# Patient Record
Sex: Female | Born: 1940
Health system: Southern US, Community
[De-identification: ages and names within clinical notes are randomized; demographics above are authoritative.]

## PROBLEM LIST (undated history)

## (undated) DIAGNOSIS — N809 Endometriosis, unspecified: Secondary | ICD-10-CM

## (undated) DIAGNOSIS — M810 Age-related osteoporosis without current pathological fracture: Secondary | ICD-10-CM

## (undated) DIAGNOSIS — F0390 Unspecified dementia without behavioral disturbance: Secondary | ICD-10-CM

## (undated) DIAGNOSIS — M1990 Unspecified osteoarthritis, unspecified site: Secondary | ICD-10-CM

## (undated) DIAGNOSIS — Z8781 Personal history of (healed) traumatic fracture: Secondary | ICD-10-CM

## (undated) DIAGNOSIS — E785 Hyperlipidemia, unspecified: Secondary | ICD-10-CM

## (undated) HISTORY — DX: Endometriosis, unspecified: N80.9

## (undated) HISTORY — DX: Personal history of (healed) traumatic fracture: Z87.81

## (undated) HISTORY — DX: Age-related osteoporosis without current pathological fracture: M81.0

## (undated) HISTORY — PX: ABDOMINAL HYSTERECTOMY: SHX81

## (undated) HISTORY — DX: Unspecified osteoarthritis, unspecified site: M19.90

## (undated) HISTORY — PX: SPINE SURGERY: SHX786

## (undated) HISTORY — DX: Hyperlipidemia, unspecified: E78.5

---

## 2006-04-24 ENCOUNTER — Ambulatory Visit: Payer: Self-pay | Admitting: Family Medicine

## 2006-07-21 ENCOUNTER — Emergency Department (HOSPITAL_COMMUNITY): Admission: EM | Admit: 2006-07-21 | Discharge: 2006-07-21 | Payer: Self-pay | Admitting: Emergency Medicine

## 2006-12-18 ENCOUNTER — Ambulatory Visit: Payer: Self-pay | Admitting: Family Medicine

## 2009-08-06 ENCOUNTER — Ambulatory Visit: Payer: Self-pay | Admitting: Interventional Radiology

## 2009-08-06 ENCOUNTER — Emergency Department (HOSPITAL_BASED_OUTPATIENT_CLINIC_OR_DEPARTMENT_OTHER): Admission: EM | Admit: 2009-08-06 | Discharge: 2009-08-06 | Payer: Self-pay | Admitting: Emergency Medicine

## 2010-09-16 LAB — URINE MICROSCOPIC-ADD ON

## 2010-09-16 LAB — URINALYSIS, ROUTINE W REFLEX MICROSCOPIC
Glucose, UA: NEGATIVE mg/dL
Nitrite: NEGATIVE
Protein, ur: 30 mg/dL — AB
Specific Gravity, Urine: 1.026 (ref 1.005–1.030)
Urobilinogen, UA: 1 mg/dL (ref 0.0–1.0)
pH: 6 (ref 5.0–8.0)

## 2010-09-16 LAB — URINE CULTURE: Culture: NO GROWTH

## 2013-06-24 ENCOUNTER — Ambulatory Visit (INDEPENDENT_AMBULATORY_CARE_PROVIDER_SITE_OTHER): Payer: Medicare Other | Admitting: General Practice

## 2013-06-24 ENCOUNTER — Encounter: Payer: Self-pay | Admitting: General Practice

## 2013-06-24 VITALS — BP 140/79 | HR 80 | Temp 97.5°F | Ht 61.5 in | Wt 91.0 lb

## 2013-06-24 DIAGNOSIS — Z0000 Encounter for general adult medical examination without abnormal findings: Secondary | ICD-10-CM

## 2013-06-24 NOTE — Progress Notes (Signed)
   Subjective:    Patient ID: Eileen Kidd, female    DOB: 08-Oct-1940, 72 y.o.   MRN: [id number]  HPI Patient presents for annual physical exam. She denies any chronic health conditions or taking prescription medications. Reports decreasing cigarette smoking over past 6 months. She has gone from smoking 1.5 ppd to 1pack per week. Reports eating a healthy diet and walks for exercise as tolerated due to right knee injury.     Review of Systems  Constitutional: Negative for fever and chills.  Respiratory: Negative for chest tightness and shortness of breath.   Cardiovascular: Negative for chest pain and palpitations.  All other systems reviewed and are negative.       Objective:   Physical Exam  Constitutional: She is oriented to person, place, and time. She appears well-developed and well-nourished.  HENT:  Head: Normocephalic and atraumatic.  Right Ear: External ear normal.  Left Ear: External ear normal.  Eyes: EOM are normal. Pupils are equal, round, and reactive to light.  Neck: Normal range of motion. Neck supple. No thyromegaly present.  Cardiovascular: Normal rate, regular rhythm and normal heart sounds.   Pulmonary/Chest: Effort normal and breath sounds normal. No respiratory distress. She exhibits no tenderness.  Abdominal: Soft. Bowel sounds are normal. She exhibits no distension. There is no tenderness.  Lymphadenopathy:    She has no cervical adenopathy.  Neurological: She is alert and oriented to person, place, and time.  Skin: Skin is warm and dry.  Psychiatric: She has a normal mood and affect.          Assessment & Plan:  1. Annual physical exam - POCT CBC - CMP14+EGFR - NMR, lipoprofile Labs pending Discussed benefits of regular exercise and healthy eating RTO prn and for routine follow up Patient verbalized understanding Coralie Keens, FNP-C

## 2013-06-24 NOTE — Patient Instructions (Signed)

## 2013-06-25 LAB — CBC WITH DIFFERENTIAL
Basos: 1 %
Eos: 1 %
Immature Grans (Abs): 0 10*3/uL (ref 0.0–0.1)
Immature Granulocytes: 0 %
Lymphocytes Absolute: 3.4 10*3/uL — ABNORMAL HIGH (ref 0.7–3.1)
Monocytes Absolute: 0.9 10*3/uL (ref 0.1–0.9)
Monocytes: 9 %
Neutrophils Absolute: 5.3 10*3/uL (ref 1.4–7.0)
Neutrophils Relative %: 54 %
RBC: 4.36 x10E6/uL (ref 3.77–5.28)
RDW: 13.9 % (ref 12.3–15.4)
WBC: 9.8 10*3/uL (ref 3.4–10.8)

## 2013-06-25 LAB — NMR, LIPOPROFILE
HDL Cholesterol by NMR: 81 mg/dL (ref >=40)
LDL Size: 21.2 nm (ref >20.5)
Small LDL Particle Number: <90 nmol/L (ref <=527)
Triglycerides by NMR: 83 mg/dL (ref <150)

## 2013-06-25 LAB — CMP14+EGFR
Albumin/Globulin Ratio: 2.3 (ref 1.1–2.5)
BUN/Creatinine Ratio: 16 (ref 11–26)
BUN: 10 mg/dL (ref 8–27)
Calcium: 9.9 mg/dL (ref 8.6–10.2)
Creatinine, Ser: 0.61 mg/dL (ref 0.57–1.00)
GFR calc non Af Amer: 90 mL/min/{1.73_m2} (ref >59)
Globulin, Total: 2 g/dL (ref 1.5–4.5)
Glucose: 96 mg/dL (ref 65–99)
Potassium: 4.8 mmol/L (ref 3.5–5.2)
Total Bilirubin: 0.3 mg/dL (ref 0.0–1.2)
Total Protein: 6.6 g/dL (ref 6.0–8.5)

## 2013-06-27 ENCOUNTER — Other Ambulatory Visit: Payer: Self-pay | Admitting: General Practice

## 2013-07-03 ENCOUNTER — Telehealth: Payer: Self-pay | Admitting: General Practice

## 2013-07-03 NOTE — Telephone Encounter (Signed)
Patient aware.

## 2013-08-28 ENCOUNTER — Telehealth: Payer: Self-pay | Admitting: General Practice

## 2013-08-29 NOTE — Telephone Encounter (Signed)
PAtient aware 

## 2013-08-29 NOTE — Telephone Encounter (Signed)
Patient aware.

## 2014-05-28 ENCOUNTER — Telehealth: Payer: Self-pay | Admitting: Family Medicine

## 2014-05-28 ENCOUNTER — Ambulatory Visit: Payer: Medicare Other | Admitting: Family Medicine

## 2014-05-28 NOTE — Telephone Encounter (Signed)
APPT MADE

## 2014-08-15 ENCOUNTER — Ambulatory Visit (INDEPENDENT_AMBULATORY_CARE_PROVIDER_SITE_OTHER): Payer: Medicare Other | Admitting: Family Medicine

## 2014-08-15 ENCOUNTER — Encounter: Payer: Self-pay | Admitting: Family Medicine

## 2014-08-15 VITALS — BP 124/62 | HR 82 | Temp 97.1°F | Ht 61.5 in | Wt 100.0 lb

## 2014-08-15 DIAGNOSIS — Z0000 Encounter for general adult medical examination without abnormal findings: Secondary | ICD-10-CM

## 2014-08-15 NOTE — Progress Notes (Deleted)
   Subjective:    Patient ID: Eileen Kidd, female    DOB: 07-Apr-1941, 74 y.o.   MRN: [id number]  HPI Eileen Kidd is acute little 74 -year-old female who is here for a physical. 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 She smokes 2-3 cigarettes a day. She has no chronic diseases. Her only medications are a women's vitamin a baby aspirin and fish oil. Her basic risk is for osteoporosis given her slight stature but she has gained about 9 pounds since her last visit here little over a year ago. She has no complaints except for some arthritis in her left knee, that due to old trauma.  There are no active problems to display for this patient.  Outpatient Encounter Prescriptions as of 08/15/2014  Medication Sig  . aspirin 81 MG tablet Take 81 mg by mouth daily.  . Multiple Vitamins-Minerals (MULTIVITAMIN & MINERAL PO) Take by mouth.  . Omega-3 Fatty Acids (FISH OIL) 1000 MG CAPS Take 1,000 mg by mouth 2 (two) times daily.     Review of Systems  Constitutional: Negative.   HENT: Negative.   Eyes: Negative.   Respiratory: Negative.   Cardiovascular: Negative.   Gastrointestinal: Negative.   Endocrine: Negative.   Genitourinary: Negative.   Hematological: Negative.   Psychiatric/Behavioral: Negative.        Objective:   Physical Exam  Constitutional: She is oriented to person, place, and time. She appears well-developed and well-nourished.  Eyes: Conjunctivae and EOM are normal.  Neck: Normal range of motion. Neck supple.  Cardiovascular: Normal rate, regular rhythm and normal heart sounds.   Pulmonary/Chest: Effort normal and breath sounds normal.  Abdominal: Soft. Bowel sounds are normal.  Musculoskeletal: Normal range of motion.  Neurological: She is alert and oriented to person, place, and time. She has normal reflexes.  Skin: Skin is warm and dry.  Psychiatric: She has a normal mood and affect. Her behavior is normal. Thought content normal.    BP 124/62 mmHg  Pulse 82   Temp(Src) 97.1 F (36.2 C) (Oral)  Ht 5' 1.5" (1.562 m)  Wt 100 lb (45.36 kg)  BMI 18.59 kg/m2       Assessment & Plan:  1. Annual physical exam Exam is normal.  Has aged out of basic screening exams except bone density.  Would do labs next year, eg glucose, lipids, CBC  Frederica KusterStephen M Miller MD

## 2014-08-18 ENCOUNTER — Telehealth: Payer: Self-pay | Admitting: Family Medicine

## 2014-08-18 NOTE — Telephone Encounter (Signed)
Stp advised he plans to do labs next year.

## 2014-10-14 ENCOUNTER — Ambulatory Visit: Payer: Medicare Other | Admitting: *Deleted

## 2015-04-01 ENCOUNTER — Ambulatory Visit: Payer: Medicare Other | Admitting: Family Medicine

## 2015-04-02 ENCOUNTER — Ambulatory Visit: Payer: Medicare Other | Admitting: Family Medicine

## 2015-04-14 ENCOUNTER — Ambulatory Visit (INDEPENDENT_AMBULATORY_CARE_PROVIDER_SITE_OTHER): Payer: Medicare Other | Admitting: Family Medicine

## 2015-04-14 ENCOUNTER — Encounter: Payer: Self-pay | Admitting: Family Medicine

## 2015-04-14 VITALS — BP 131/66 | HR 72 | Temp 98.4°F | Ht 61.5 in | Wt 101.8 lb

## 2015-04-14 DIAGNOSIS — Z0000 Encounter for general adult medical examination without abnormal findings: Secondary | ICD-10-CM

## 2015-04-14 DIAGNOSIS — Z23 Encounter for immunization: Secondary | ICD-10-CM

## 2015-04-14 DIAGNOSIS — Z13820 Encounter for screening for osteoporosis: Secondary | ICD-10-CM

## 2015-04-14 NOTE — Progress Notes (Signed)
Subjective:   Eileen Kidd is a 74 y.o. female who presents for Medicare Annual (Subsequent) preventive examination.  Review of Systems:  Review of Systems  Constitutional: Negative for fever and chills.  HENT: Negative for ear pain and tinnitus.   Eyes: Negative for blurred vision and pain.  Respiratory: Negative for cough, shortness of breath and wheezing.   Cardiovascular: Negative for chest pain, palpitations and leg swelling.  Gastrointestinal: Negative for abdominal pain, diarrhea, constipation, blood in stool and melena.  Genitourinary: Negative for dysuria and hematuria.  Musculoskeletal: Negative for myalgias, back pain and joint pain.  Skin: Negative for rash.  Neurological: Negative for dizziness, sensory change, focal weakness, weakness and headaches.  Psychiatric/Behavioral: Negative for depression and suicidal ideas.      Objective:     Vitals: BP 131/66 mmHg  Pulse 72  Temp(Src) 98.4 F (36.9 C) (Oral)  Ht 5' 1.5" (1.562 m)  Wt 101 lb 12.8 oz (46.176 kg)  BMI 18.93 kg/m2  Tobacco History  Smoking status  . Current Every Day Smoker -- 0.25 packs/day  . Types: Cigarettes  Smokeless tobacco  . Never Used     Ready to quit: Not Answered Counseling given: Not Answered   Past Medical History  Diagnosis Date  . Arthritis     left knee   Past Surgical History  Procedure Laterality Date  . Spine surgery      L5-S1 removal   Family History  Problem Relation Age of Onset  . Heart attack Mother   . Heart attack Father   . Diabetes Brother   . Stroke Brother    History  Sexual Activity  . Sexual Activity: Not on file    Outpatient Encounter Prescriptions as of 04/14/2015  Medication Sig  . aspirin 81 MG tablet Take 81 mg by mouth daily.  . Multiple Vitamins-Minerals (MULTIVITAMIN & MINERAL PO) Take by mouth.  . Omega-3 Fatty Acids (FISH OIL) 1000 MG CAPS Take 1,000 mg by mouth 2 (two) times daily.   No facility-administered encounter  medications on file as of 04/14/2015.    Activities of Daily Living In your present state of health, do you have any difficulty performing the following activities: 04/14/2015  Hearing? N  Vision? N  Difficulty concentrating or making decisions? N  Walking or climbing stairs? N  Dressing or bathing? N  Doing errands, shopping? N  Preparing Food and eating ? N  Using the Toilet? N  In the past six months, have you accidently leaked urine? N  Do you have problems with loss of bowel control? N  Managing your Medications? N  Managing your Finances? N  Housekeeping or managing your Housekeeping? N    Patient Care Team: Nils PyleJoshua A Dettinger, MD as PCP - General (Family Medicine)    Assessment:    Problem List Items Addressed This Visit    None    Visit Diagnoses    Routine history and physical examination of adult    -  Primary    Osteoporosis screening        Relevant Orders    DG Bone Density      Exercise Activities and Dietary recommendations Current Exercise Habits:: Home exercise routine, Type of exercise: walking;strength training/weights, Time (Minutes): 50, Frequency (Times/Week): 4, Weekly Exercise (Minutes/Week): 200, Intensity: Moderate  Goals    None     Fall Risk Fall Risk  04/14/2015 08/15/2014  Falls in the past year? No No   Depression Screen PHQ 2/9  Scores 04/14/2015 08/15/2014  PHQ - 2 Score 0 0     Cognitive Testing MMSE - Mini Mental State Exam 04/14/2015  Orientation to time 5  Orientation to Place 5  Registration 3  Attention/ Calculation 5  Recall 2  Language- name 2 objects 2  Language- repeat 1  Language- follow 3 step command 3  Language- read & follow direction 1  Write a sentence 1  Copy design 1  Total score 29    Immunization History  Administered Date(s) Administered  . Influenza Split 04/03/2013  . Influenza, High Dose Seasonal PF 04/18/2014   Screening Tests Health Maintenance  Topic Date Due  . ZOSTAVAX  10/12/2015  (Originally 08/06/1998)  . DEXA SCAN  04/12/2016 (Originally 08/07/2003)  . TETANUS/TDAP  04/12/2016 (Originally 08/06/1957)  . PNA vac Low Risk Adult (1 of 2 - PCV13) 04/12/2016 (Originally 08/07/2003)  . INFLUENZA VACCINE  01/26/2016      Plan:    see assessment  During the course of the visit the patient was educated and counseled about the following appropriate screening and preventive services:   Vaccines to include Pneumoccal, Influenza, Hepatitis B, Td, Zostavax, HCV  Electrocardiogram  Cardiovascular Disease  Colorectal cancer screening  Bone density screening  Diabetes screening  Glaucoma screening  Mammography/PAP  Nutrition counseling   Patient Instructions (the written plan) was given to the patient.   Nils Pyle, MD  04/14/2015

## 2015-04-15 ENCOUNTER — Ambulatory Visit (INDEPENDENT_AMBULATORY_CARE_PROVIDER_SITE_OTHER): Payer: Medicare Other

## 2015-04-15 DIAGNOSIS — Z780 Asymptomatic menopausal state: Secondary | ICD-10-CM | POA: Diagnosis not present

## 2015-04-15 DIAGNOSIS — Z13820 Encounter for screening for osteoporosis: Secondary | ICD-10-CM

## 2015-04-16 ENCOUNTER — Encounter: Payer: Self-pay | Admitting: Pharmacist

## 2015-04-16 ENCOUNTER — Ambulatory Visit (INDEPENDENT_AMBULATORY_CARE_PROVIDER_SITE_OTHER): Payer: Medicare Other | Admitting: Pharmacist

## 2015-04-16 ENCOUNTER — Telehealth: Payer: Self-pay | Admitting: Family Medicine

## 2015-04-16 VITALS — Ht 61.5 in | Wt 100.5 lb

## 2015-04-16 DIAGNOSIS — M810 Age-related osteoporosis without current pathological fracture: Secondary | ICD-10-CM

## 2015-04-16 MED ORDER — ALENDRONATE SODIUM 70 MG PO TABS: 70.0000 mg | ORAL_TABLET | ORAL | Status: DC

## 2015-04-16 NOTE — Progress Notes (Signed)
[facility]steoporosis [facility] Current Height: Height: 5' 1.5" (156.2 cm)      Max Lifetime Height:  5' 2" Current Weight: Weight: 100 lb 8 oz (45.587 kg)       Ethnicity:Caucasian    HPI: Does pt already have a diagnosis of:  Osteopenia?  No Osteoporosis?  No  Back Pain?  No       Kyphosis?  No Prior fracture?  Yes knee cap - caused by fall and  Med(s) for Osteoporosis/Osteopenia:  none Med(s) previously tried for Osteoporosis/Osteopenia:  none                                                             PMH: Age at menopause:  56's Hysterectomy?  No Oophorectomy?  No HRT? No Steroid Use?  No Thyroid med?  No History of cancer?  No History of digestive disorders (ie Crohn's)?  No Current or previous eating disorders?  No Last Vitamin D Result:  Not done Last GFR Result:  Not done in 2 years   FH/SH: Family history of osteoporosis?  No Parent with history of hip fracture?  No Family history of breast cancer?  Yes - daughter Exercise?  Yes - walking and leg lifts Smoking?  Yes Alcohol?  No    Calcium Assessment Calcium Intake  # of servings/day  Calcium mg  Milk (8 oz) 0.5  x  300  = 157m  Yogurt (4 oz) 1 x  200 = 2037m Cheese (1 oz) 0 x  200 = 0  Other Calcium sources   25031mCa supplement MVI = 400m54mEstimated calcium intake per day 1000mg46mDEXA Results Date of Test T-Score for AP Spine L1-L4 T-Score for Total Left Hip T-Score for Total Right Hip  04/15/2015 -4.9 -4.2 -4.2                  Assessment: Osteoporosis with history of knee cap fracture   Recommendations: 1.  Start  alendronate (FOSAMAX) 70mg 21m 1 tablet weekly 2.  recommend calcium 1200mg d23m through supplementation or diet.  3.  recommend weight bearing exercise - 30 minutes at least 4 days per week.   4.  Counseled and educated about fall risk and prevention. 5.  Discussed smoking's effects on bone health.  Patient is considering stopping but is not ready yet.  Tips for cessation discussed  and patient to notify office if she needed assistance or Rx for smoking cessation aids.  Orders Placed This Encounter  Procedures  . CMP14+EGFR  . Vit D  25 hydroxy (rtn osteoporosis monitoring)  . PTH, Intact and Calcium     Recheck DEXA:  2 years  Time spent counseling patient:  30 minutes   Nona Gracey ECherre RobinsD, CPP

## 2015-04-16 NOTE — Patient Instructions (Signed)
Bone Health Bones protect organs, store calcium, and anchor muscles. Good health habits, such as eating nutritious foods and exercising regularly, are important for maintaining healthy bones. They can also help to prevent a condition that causes bones to lose density and become weak and brittle (osteoporosis). WHY IS BONE MASS IMPORTANT? Bone mass refers to the amount of bone tissue that you have. The higher your bone mass, the stronger your bones. An important step toward having healthy bones throughout life is to have strong and dense bones during childhood. A young adult who has a high bone mass is more likely to have a high bone mass later in life. Bone mass at its greatest it is called peak bone mass. A large decline in bone mass occurs in older adults. In women, it occurs about the time of menopause. During this time, it is important to practice good health habits, because if more bone is lost than what is replaced, the bones will become less healthy and more likely to break (fracture). If you find that you have a low bone mass, you may be able to prevent osteoporosis or further bone loss by changing your diet and lifestyle. HOW CAN I FIND OUT IF MY BONE MASS IS LOW? Bone mass can be measured with an X-ray test that is called a bone mineral density (BMD) test. This test is recommended for all women who are age 65 or older. It may also be recommended for men who are age 70 or older, or for people who are more likely to develop osteoporosis due to:  Having bones that break easily.  Having a long-term disease that weakens bones, such as kidney disease or rheumatoid arthritis.  Having menopause earlier than normal.  Taking medicine that weakens bones, such as steroids, thyroid hormones, or hormone treatment for breast cancer or prostate cancer.  Smoking.  Drinking three or more alcoholic drinks each day. WHAT ARE THE NUTRITIONAL RECOMMENDATIONS FOR HEALTHY BONES? To have healthy bones, you need  to get enough of the right minerals and vitamins. Most nutrition experts recommend getting these nutrients from the foods that you eat. Nutritional recommendations vary from person to person. Ask your health care provider what is healthy for you. Here are some general guidelines. Calcium Recommendations Calcium is the most important (essential) mineral for bone health. Most people can get enough calcium from their diet, but supplements may be recommended for people who are at risk for osteoporosis. Good sources of calcium include:  Dairy products, such as low-fat or nonfat milk, cheese, and yogurt.  Dark green leafy vegetables, such as bok choy and broccoli.  Calcium-fortified foods, such as orange juice, cereal, bread, soy beverages, and tofu products.  Nuts, such as almonds. Follow these recommended amounts for daily calcium intake:  Children, age 1-3: 700 mg.  Children, age 4-8: 1,000 mg.  Children, age 9-13: 1,300 mg.  Teens, age 14-18: 1,300 mg.  Adults, age 19-50: 1,000 mg.  Adults, age 51-70:  Men: 1,000 mg.  Women: 1,200 mg.  Adults, age 71 or older: 1,200 mg.  Pregnant and breastfeeding females:  Teens: 1,300 mg.  Adults: 1,000 mg. Vitamin D Recommendations Vitamin D is the most essential vitamin for bone health. It helps the body to absorb calcium. Sunlight stimulates the skin to make vitamin D, so be sure to get enough sunlight. If you live in a cold climate or you do not get outside often, your health care provider may recommend that you take vitamin D supplements. Good   sources of vitamin D in your diet include:  Egg yolks.  Saltwater fish.  Milk and cereal fortified with vitamin D. Follow these recommended amounts for daily vitamin D intake:  Children and teens, age 1-18: 600 international units.  Adults, age 50 or younger: 400-800 international units.  Adults, age 51 or older: 800-1,000 international units. Other Nutrients Other nutrients for bone  health include:  Phosphorus. This mineral is found in meat, poultry, dairy foods, nuts, and legumes. The recommended daily intake for adult men and adult women is 700 mg.  Magnesium. This mineral is found in seeds, nuts, dark green vegetables, and legumes. The recommended daily intake for adult men is 400-420 mg. For adult women, it is 310-320 mg.  Vitamin K. This vitamin is found in green leafy vegetables. The recommended daily intake is 120 mg for adult men and 90 mg for adult women. WHAT TYPE OF PHYSICAL ACTIVITY IS BEST FOR BUILDING AND MAINTAINING HEALTHY BONES? Weight-bearing and strength-building activities are important for building and maintaining peak bone mass. Weight-bearing activities cause muscles and bones to work against gravity. Strength-building activities increases muscle strength that supports bones. Weight-bearing and muscle-building activities include:  Walking and hiking.  Jogging and running.  Dancing.  Gym exercises.  Lifting weights.  Tennis and racquetball.  Climbing stairs.  Aerobics. Adults should get at least 30 minutes of moderate physical activity on most days. Children should get at least 60 minutes of moderate physical activity on most days. Ask your health care provide what type of exercise is best for you. WHERE CAN I FIND MORE INFORMATION? For more information, check out the following websites:  National Osteoporosis Foundation: http://nof.org/learn/basics  National Institutes of Health: http://www.niams.nih.gov/Health_Info/Bone/Bone_Health/bone_health_for_life.asp   This information is not intended to replace advice given to you by your health care provider. Make sure you discuss any questions you have with your health care provider.   Document Released: 09/03/2003 Document Revised: 10/28/2014 Document Reviewed: 06/18/2014 Elsevier Interactive Patient Education 2016 [employer]  

## 2015-04-16 NOTE — Telephone Encounter (Signed)
Patient has question about when to start alendronate.  Question answered - recommended that she wait to start until labs return.

## 2015-04-17 LAB — CMP14+EGFR
A/G RATIO: 2.3 (ref 1.1–2.5)
ALT: 11 IU/L (ref 0–32)
AST: 19 IU/L (ref 0–40)
Albumin: 4.4 g/dL (ref 3.5–4.8)
Alkaline Phosphatase: 54 IU/L (ref 39–117)
BILIRUBIN TOTAL: 0.3 mg/dL (ref 0.0–1.2)
BUN/Creatinine Ratio: 15 (ref 11–26)
BUN: 9 mg/dL (ref 8–27)
CHLORIDE: 99 mmol/L (ref 97–106)
CO2: 25 mmol/L (ref 18–29)
Calcium: 9.9 mg/dL (ref 8.7–10.3)
Creatinine, Ser: 0.61 mg/dL (ref 0.57–1.00)
GFR calc Af Amer: 102 mL/min/{1.73_m2} (ref >59)
GFR calc non Af Amer: 88 mL/min/{1.73_m2} (ref >59)
GLOBULIN, TOTAL: 1.9 g/dL (ref 1.5–4.5)
Glucose: 107 mg/dL — ABNORMAL HIGH (ref 65–99)
POTASSIUM: 4.9 mmol/L (ref 3.5–5.2)
SODIUM: 139 mmol/L (ref 136–144)
Total Protein: 6.3 g/dL (ref 6.0–8.5)

## 2015-04-17 LAB — PTH, INTACT AND CALCIUM: PTH: 22 pg/mL (ref 15–65)

## 2015-04-17 LAB — VITAMIN D 25 HYDROXY (VIT D DEFICIENCY, FRACTURES): Vit D, 25-Hydroxy: 36.8 ng/mL (ref 30.0–100.0)

## 2015-04-21 ENCOUNTER — Telehealth: Payer: Self-pay | Admitting: Pharmacist

## 2015-04-22 NOTE — Telephone Encounter (Signed)
Labs have been mailed.

## 2015-06-17 NOTE — Progress Notes (Signed)
   Subjective:    Patient ID: Eileen Kidd, female    DOB: 1940-10-30, 74 y.o.   MRN: [id number]  HPI Eileen Kidd is acute little 74 -year-old female who is here for a physical. 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 She smokes 2-3 cigarettes a day. She has no chronic diseases. Her only medications are a women's vitamin a baby aspirin and fish oil. Her basic risk is for osteoporosis given her slight stature but she has gained about 9 pounds since her last visit here little over a year ago. She has no complaints except for some arthritis in her left knee, that due to old trauma.  There are no active problems to display for this patient.  Outpatient Encounter Prescriptions as of 08/15/2014  Medication Sig  . aspirin 81 MG tablet Take 81 mg by mouth daily.  . Multiple Vitamins-Minerals (MULTIVITAMIN & MINERAL PO) Take by mouth.  . Omega-3 Fatty Acids (FISH OIL) 1000 MG CAPS Take 1,000 mg by mouth 2 (two) times daily.     Review of Systems  Constitutional: Negative.   HENT: Negative.   Eyes: Negative.   Respiratory: Negative.   Cardiovascular: Negative.   Gastrointestinal: Negative.   Endocrine: Negative.   Genitourinary: Negative.   Hematological: Negative.   Psychiatric/Behavioral: Negative.        Objective:   Physical Exam  Constitutional: She is oriented to person, place, and time. She appears well-developed and well-nourished.  Eyes: Conjunctivae and EOM are normal.  Neck: Normal range of motion. Neck supple.  Cardiovascular: Normal rate, regular rhythm and normal heart sounds.   Pulmonary/Chest: Effort normal and breath sounds normal.  Abdominal: Soft. Bowel sounds are normal.  Musculoskeletal: Normal range of motion.  Neurological: She is alert and oriented to person, place, and time. She has normal reflexes.  Skin: Skin is warm and dry.  Psychiatric: She has a normal mood and affect. Her behavior is normal. Thought content normal.    BP 124/62 mmHg  Pulse 82   Temp(Src) 97.1 F (36.2 C) (Oral)  Ht 5' 1.5" (1.562 m)  Wt 100 lb (45.36 kg)  BMI 18.59 kg/m2       Assessment & Plan:  1. Annual physical exam Exam is normal.  Has aged out of basic screening exams except bone density.  Would do labs next year, eg glucose, lipids, CBC

## 2015-07-17 ENCOUNTER — Ambulatory Visit: Payer: Self-pay | Admitting: Family Medicine

## 2015-07-20 ENCOUNTER — Ambulatory Visit: Payer: Self-pay | Admitting: Family Medicine

## 2015-07-21 ENCOUNTER — Encounter: Payer: Self-pay | Admitting: Family Medicine

## 2015-07-24 ENCOUNTER — Ambulatory Visit (INDEPENDENT_AMBULATORY_CARE_PROVIDER_SITE_OTHER): Payer: Medicare Other | Admitting: Family Medicine

## 2015-07-24 ENCOUNTER — Encounter: Payer: Self-pay | Admitting: Family Medicine

## 2015-07-24 ENCOUNTER — Telehealth: Payer: Self-pay

## 2015-07-24 VITALS — BP 139/71 | HR 78 | Temp 97.7°F | Ht 61.5 in | Wt 103.0 lb

## 2015-07-24 DIAGNOSIS — M810 Age-related osteoporosis without current pathological fracture: Secondary | ICD-10-CM | POA: Diagnosis not present

## 2015-07-24 NOTE — Progress Notes (Signed)
BP 139/71 mmHg  Pulse 78  Temp(Src) 97.7 F (36.5 C) (Oral)  Ht 5' 1.5" (1.562 m)  Wt 103 lb (46.72 kg)  BMI 19.15 kg/m2   Subjective:    Patient ID: Eileen Kidd, female    DOB: 04/10/1941, 75 y.o.   MRN: [id number]  HPI: Eileen Kidd is a 75 y.o. female presenting on 07/24/2015 for Labwork, 3 month followup   HPI Osteoporosis Patient presents today because she has been having issues with the Fosamax which she is taking for osteoporosis. The reviewing her bone scan that shows that her T score was negative 4 and she does have a very significant need for a medication for osteoporosis. We discussed the possibility of the injectables how they cause less upset stomach that she had with the Fosamax. She is agreeable to try one of those and we will set her up with appointment with Cherre Robins after prior authorization to run to see that they will be covered. She also feels like she may have fallen and cracked one of her ribs on the left side of her chest couple weeks ago. She still is having a little bit of pain with it but no difficulties breathing and does not necessarily want to do an x-ray today.  Relevant past medical, surgical, family and social history reviewed and updated as indicated. Interim medical history since our last visit reviewed. Allergies and medications reviewed and updated.  Review of Systems  Constitutional: Negative for fever and chills.  HENT: Negative for congestion, ear discharge and ear pain.   Eyes: Negative for redness and visual disturbance.  Respiratory: Negative for chest tightness and shortness of breath.   Cardiovascular: Negative for chest pain and leg swelling.  Gastrointestinal: Positive for nausea, abdominal pain and diarrhea. Negative for vomiting, constipation and blood in stool.  Genitourinary: Negative for dysuria and difficulty urinating.  Musculoskeletal: Positive for arthralgias. Negative for back pain, joint swelling and gait problem.    Skin: Negative for rash.  Neurological: Negative for dizziness, light-headedness and headaches.  Psychiatric/Behavioral: Negative for behavioral problems and agitation.  All other systems reviewed and are negative.   Per HPI unless specifically indicated above     Medication List       This list is accurate as of: 07/24/15  8:52 AM.  Always use your most recent med list.               aspirin 81 MG tablet  Take 81 mg by mouth daily.     Fish Oil 1000 MG Caps  Take 1,000 mg by mouth 2 (two) times daily.     MULTIVITAMIN & MINERAL PO  Take by mouth.           Objective:    BP 139/71 mmHg  Pulse 78  Temp(Src) 97.7 F (36.5 C) (Oral)  Ht 5' 1.5" (1.562 m)  Wt 103 lb (46.72 kg)  BMI 19.15 kg/m2  Wt Readings from Last 3 Encounters:  07/24/15 103 lb (46.72 kg)  04/16/15 100 lb 8 oz (45.587 kg)  04/14/15 101 lb 12.8 oz (46.176 kg)    Physical Exam  Constitutional: She is oriented to person, place, and time. She appears well-developed and well-nourished. No distress.  Eyes: Conjunctivae and EOM are normal. Pupils are equal, round, and reactive to light.  Neck: Neck supple. No thyromegaly present.  Cardiovascular: Normal rate, regular rhythm, normal heart sounds and intact distal pulses.   No murmur heard. Pulmonary/Chest: Effort normal and breath  sounds normal. No respiratory distress. She has no wheezes.  Abdominal: Soft. Bowel sounds are normal. She exhibits no distension. There is no tenderness. There is no rebound.  Musculoskeletal: Normal range of motion. She exhibits tenderness (slight tenderness over left lateral ribs but no mobility is detected.). She exhibits no edema.  Lymphadenopathy:    She has no cervical adenopathy.  Neurological: She is alert and oriented to person, place, and time. Coordination normal.  Skin: Skin is warm and dry. No rash noted. She is not diaphoretic.  Psychiatric: She has a normal mood and affect. Her behavior is normal.  Nursing  note and vitals reviewed.   Results for orders placed or performed in visit on 04/16/15  CMP14+EGFR  Result Value Ref Range   Glucose 107 (H) 65 - 99 mg/dL   BUN 9 8 - 27 mg/dL   Creatinine, Ser 0.61 0.57 - 1.00 mg/dL   GFR calc non Af Amer 88 >59 mL/min/1.73   GFR calc Af Amer 102 >59 mL/min/1.73   BUN/Creatinine Ratio 15 11 - 26   Sodium 139 136 - 144 mmol/L   Potassium 4.9 3.5 - 5.2 mmol/L   Chloride 99 97 - 106 mmol/L   CO2 25 18 - 29 mmol/L   Calcium 9.9 8.7 - 10.3 mg/dL   Total Protein 6.3 6.0 - 8.5 g/dL   Albumin 4.4 3.5 - 4.8 g/dL   Globulin, Total 1.9 1.5 - 4.5 g/dL   Albumin/Globulin Ratio 2.3 1.1 - 2.5   Bilirubin Total 0.3 0.0 - 1.2 mg/dL   Alkaline Phosphatase 54 39 - 117 IU/L   AST 19 0 - 40 IU/L   ALT 11 0 - 32 IU/L  Vit D  25 hydroxy (rtn osteoporosis monitoring)  Result Value Ref Range   Vit D, 25-Hydroxy 36.8 30.0 - 100.0 ng/mL  PTH, Intact and Calcium  Result Value Ref Range   PTH 22 15 - 65 pg/mL   PTH Comment       Assessment & Plan:   Problem List Items Addressed This Visit      Musculoskeletal and Integument   Osteoporosis - Primary    Patient did not tolerate Fosamax and wants to try an injectable, will set up with Tammy to see if we can get an injectable approved through her insurance         Follow up plan: Return in about 6 months (around 01/21/2016), or if symptoms worsen or fail to improve, for Needs appointment with Tammy for osteoporosis.  Counseling provided for all of the vaccine components No orders of the defined types were placed in this encounter.    Caryl Pina, MD [facility] 07/24/2015, 8:52 AM

## 2015-07-24 NOTE — Telephone Encounter (Signed)
Will verify cost of prolia, Reclast and Forteo for patient and discuss at appt 08/12/15

## 2015-07-24 NOTE — Patient Instructions (Signed)
Patient should take at least 500 mg of supplemental calcium and 800 international units of vitamin D daily.  Patient did not tolerate Fosamax and wants to try an injectable, will set up with Tammy to see if we can get an injectable approved through her insurance for the osteoporosis.

## 2015-07-24 NOTE — Telephone Encounter (Signed)
Dr. Louanne Skye had spoken with you about this patient this morning.  She had been on Fosamax but has discontinued because of side effects.  Dr. Louanne Skye had spoken with you about injectables for her and you were going to check on coverage with her insurance before her upcoming appointment with you.  This message is sent just as a reminder.  Thank you!!

## 2015-07-28 NOTE — Assessment & Plan Note (Signed)
Patient did not tolerate Fosamax and wants to try an injectable, will set up with Tammy to see if we can get an injectable approved through her insurance

## 2015-08-12 ENCOUNTER — Encounter: Payer: Self-pay | Admitting: Pharmacist

## 2015-08-12 ENCOUNTER — Ambulatory Visit (INDEPENDENT_AMBULATORY_CARE_PROVIDER_SITE_OTHER): Payer: Medicare Other | Admitting: Pharmacist

## 2015-08-12 ENCOUNTER — Ambulatory Visit: Payer: Medicare Other | Admitting: Pharmacist

## 2015-08-12 DIAGNOSIS — M810 Age-related osteoporosis without current pathological fracture: Secondary | ICD-10-CM | POA: Diagnosis not present

## 2015-08-12 NOTE — Progress Notes (Signed)
Osteoporosis Clinic  HPI: Patient with osteoporosis.  She started alendronate about 4 months ago but after 4 doses she experienced GI problems and unsteady gait which she felt was related to alendronate Does pt already have a diagnosis of:  Osteopenia?  No Osteoporosis?  No  Back Pain?  No       Kyphosis?  No Prior fracture?  Yes; knee cap - caused by fall and rib  Med(s) for Osteoporosis/Osteopenia:  none Med(s) previously tried for Osteoporosis/Osteopenia:  alendronate                                                             PMH: Age at menopause:  53's Hysterectomy?  No Oophorectomy?  No HRT? No Steroid Use?  No Thyroid med?  No History of cancer?  No History of digestive disorders (ie Crohn's)?  No Current or previous eating disorders?  No Last Vitamin D Result:  Not done Last GFR Result:  Not done in 2 years   FH/SH: Family history of osteoporosis?  No Parent with history of hip fracture?  No Family history of breast cancer?  Yes - daughter Exercise?  Yes - walking and leg lifts Smoking?  Yes - has cut back to to 4 to 5 per day and has gone as long as 4 days without smoking so far this year.   Alcohol?  No    Calcium Assessment Calcium Intake  # of servings/day  Calcium mg  Milk (8 oz) 0.5  x  300  =   Yogurt (4 oz) 1 x  200 =   Cheese (1 oz) 0 x  200 = 0  Other Calcium sources     Ca supplement MVI =    Estimated calcium intake per day     DEXA Results Date of Test T-Score for AP Spine L1-L4 T-Score for Total Left Hip T-Score for Total Right Hip  04/15/2015 -4.9 -4.2 -4.2                  Assessment: Osteoporosis with history of knee cap fracture   Recommendations: 1. Disucss several options such as Reclast, Prolia and Forteo (Forteo is non formulary).  Patient would like to try Prolia - verifying insurance coverage 2.  Increase calcium to  daily through supplementation or diet.  3.  recommend weight bearing exercise -  30 minutes at least 4 days per week.   4.  Counseled and educated about fall risk and prevention. 5.  Discussed smoking's effects on bone health.  Patient is continuing to decreased how much she is smoking. Patient given information about smoking cessation and [facility] Quit Counsellor DEXA:  2 years  Time spent counseling patient:  30 minutes   Henrene Pastor, PharmD, CPP

## 2015-08-12 NOTE — Patient Instructions (Signed)
Exercise for Strong Bones  Exercise is important to build and maintain strong bones / bone density.  There are 2 types of exercises that are important to building and maintaining strong bones:  Weight- bearing and muscle-stregthening.  Weight-bearing Exercises  These exercises include activities that make you move against gravity while staying upright. Weight-bearing exercises can be high-impact or low-impact.  High-impact weight-bearing exercises help build bones and keep them strong. If you have broken a bone due to osteoporosis or are at risk of breaking a bone, you may need to avoid high-impact exercises. If you're not sure, you should check with your healthcare provider.  Examples of high-impact weight-bearing exercises are: Dancing  Doing high-impact aerobics  Hiking  Jogging/running  Jumping Rope  Stair climbing  Tennis  Low-impact weight-bearing exercises can also help keep bones strong and are a safe alternative if you cannot do high-impact exercises.   Examples of low-impact weight-bearing exercises are: Using elliptical training machines  Doing low-impact aerobics  Using stair-step machines  Fast walking on a treadmill or outside   Muscle-Strengthening Exercises These exercises include activities where you move your body, a weight or some other resistance against gravity. They are also known as resistance exercises and include: Lifting weights  Using elastic exercise bands  Using weight machines  Lifting your own body weight  Functional movements, such as standing and rising up on your toes  Yoga and Pilates can also improve strength, balance and flexibility. However, certain positions may not be safe for people with osteoporosis or those at increased risk of broken bones. For example, exercises that have you bend forward may increase the chance of breaking a bone in the spine.   Non-Impact Exercises There are other types of exercises that can help  prevent falls.  Non-impact exercises can help you to improve balance, posture and how well you move in everyday activities. Some of these exercises include: Balance exercises that strengthen your legs and test your balance, such as Tai Chi, can decrease your risk of falls.  Posture exercises that improve your posture and reduce rounded or "sloping" shoulders can help you decrease the chance of breaking a bone, especially in the spine.  Functional exercises that improve how well you move can help you with everyday activities and decrease your chance of falling and breaking a bone. For example, if you have trouble getting up from a chair or climbing stairs, you should do these activities as exercises.   **A physical therapist can teach you balance, posture and functional exercises. He/she can also help you learn which exercises are safe and appropriate for you.  [facility] has a physical therapy office in [place] in front of our office and referrals can be made for assessments and treatment as needed and strength and balance training.  If you would like to have an assessment with Chad and our physical therapy team please let a nurse or provider know.    Fall Prevention in the Home  Falls can cause injuries and can affect people from all age groups. There are many simple things that you can do to make your home safe and to help prevent falls. WHAT CAN I DO ON THE OUTSIDE OF MY HOME?  Regularly repair the edges of walkways and driveways and fix any cracks.  Remove high doorway thresholds.  Trim any shrubbery on the main path into your home.  Use bright outdoor lighting.  Clear walkways of debris and clutter, including tools and rocks.  Regularly check   that handrails are securely fastened and in good repair. Both sides of any steps should have handrails.  Install guardrails along the edges of any raised decks or porches.  Have leaves, snow, and ice cleared regularly.  Use sand or salt on  walkways during winter months.  In the garage, clean up any spills right away, including grease or oil spills. WHAT CAN I DO IN THE BATHROOM?  Use night lights.  Install grab bars by the toilet and in the tub and shower. Do not use towel bars as grab bars.  Use non-skid mats or decals on the floor of the tub or shower.  If you need to sit down while you are in the shower, use a plastic, non-slip stool..  Keep the floor dry. Immediately clean up any water that spills on the floor.  Remove soap buildup in the tub or shower on a regular basis.  Attach bath mats securely with double-sided non-slip rug tape.  Remove throw rugs and other tripping hazards from the floor. WHAT CAN I DO IN THE BEDROOM?  Use night lights.  Make sure that a bedside light is easy to reach.  Do not use oversized bedding that drapes onto the floor.  Have a firm chair that has side arms to use for getting dressed.  Remove throw rugs and other tripping hazards from the floor. WHAT CAN I DO IN THE KITCHEN?   Clean up any spills right away.  Avoid walking on wet floors.  Place frequently used items in easy-to-reach places.  If you need to reach for something above you, use a sturdy step stool that has a grab bar.  Keep electrical cables out of the way.  Do not use floor polish or wax that makes floors slippery. If you have to use wax, make sure that it is non-skid floor wax.  Remove throw rugs and other tripping hazards from the floor. WHAT CAN I DO IN THE STAIRWAYS?  Do not leave any items on the stairs.  Make sure that there are handrails on both sides of the stairs. Fix handrails that are broken or loose. Make sure that handrails are as long as the stairways.  Check any carpeting to make sure that it is firmly attached to the stairs. Fix any carpet that is loose or worn.  Avoid having throw rugs at the top or bottom of stairways, or secure the rugs with carpet tape to prevent them from  moving.  Make sure that you have a light switch at the top of the stairs and the bottom of the stairs. If you do not have them, have them installed. WHAT ARE SOME OTHER FALL PREVENTION TIPS?  Wear closed-toe shoes that fit well and support your feet. Wear shoes that have rubber soles or low heels.  When you use a stepladder, make sure that it is completely opened and that the sides are firmly locked. Have someone hold the ladder while you are using it. Do not climb a closed stepladder.  Add color or contrast paint or tape to grab bars and handrails in your home. Place contrasting color strips on the first and last steps.  Use mobility aids as needed, such as canes, walkers, scooters, and crutches.  Turn on lights if it is dark. Replace any light bulbs that burn out.  Set up furniture so that there are clear paths. Keep the furniture in the same spot.  Fix any uneven floor surfaces.  Choose a carpet design that does not   hide the edge of steps of a stairway.  Be aware of any and all pets.  Review your medicines with your healthcare provider. Some medicines can cause dizziness or changes in blood pressure, which increase your risk of falling. Talk with your health care provider about other ways that you can decrease your risk of falls. This may include working with a physical therapist or trainer to improve your strength, balance, and endurance.   This information is not intended to replace advice given to you by your health care provider. Make sure you discuss any questions you have with your health care provider.   Document Released: 06/03/2002 Document Revised: 10/28/2014 Document Reviewed: 07/18/2014 Elsevier Interactive Patient Education 2016 [employer]vier Inc.  

## 2015-08-19 ENCOUNTER — Telehealth: Payer: Self-pay | Admitting: Pharmacist

## 2015-08-19 NOTE — Telephone Encounter (Signed)
Patient called with information regarding insurance verification for Prolia - Patient's portion would be 20% or about $170.  Patient asked if she could pay in 6 monthly payments of $30.  I will check with our insurance / payment dept.   Payment plan ok's and appt for 08/31/15.

## 2015-08-31 ENCOUNTER — Ambulatory Visit (INDEPENDENT_AMBULATORY_CARE_PROVIDER_SITE_OTHER): Payer: Medicare Other | Admitting: Pharmacist

## 2015-08-31 ENCOUNTER — Encounter (INDEPENDENT_AMBULATORY_CARE_PROVIDER_SITE_OTHER): Payer: Self-pay

## 2015-08-31 DIAGNOSIS — M810 Age-related osteoporosis without current pathological fracture: Secondary | ICD-10-CM

## 2015-08-31 MED ORDER — DENOSUMAB 60 MG/ML ~~LOC~~ SOLN
60.0000 mg | Freq: Once | SUBCUTANEOUS | Status: AC
  Administered 2015-08-31: 60 mg via SUBCUTANEOUS

## 2015-08-31 NOTE — Progress Notes (Signed)
Patient ID: Eileen Kidd, female   DOB: 21-Jul-1940, 75 y.o.   MRN: [id number] [facility]  HPI: Patient with osteoporosis.  She started alendronate about 4 months ago but after 4 doses she experienced GI problems and unsteady gait which she felt was related to alendronate.  She is getting first Prolia injection today.   Does pt already have a diagnosis of:  Osteopenia?  No Osteoporosis?  No  Back Pain?  No       Kyphosis?  No Prior fracture?  Yes; knee cap - caused by fall and rib  Med(s) for Osteoporosis/Osteopenia:  none Med(s) previously tried for Osteoporosis/Osteopenia:  alendronate                                                             PMH: Age at menopause:  6750's Hysterectomy?  No Oophorectomy?  No HRT? No Steroid Use?  No Thyroid med?  No History of cancer?  No History of digestive disorders (ie Crohn's)?  No Current or previous eating disorders?  No Last Vitamin D Result:  Not done Last GFR Result:  Not done in 2 years   FH/SH: Family history of osteoporosis?  No Parent with history of hip fracture?  No Family history of breast cancer?  Yes - daughter Exercise?  Yes - walking and leg lifts Smoking?  Yes - has cut back to to 4 to 5 per day and has gone as long as 4 days without smoking so far this year.   Alcohol?  No    Calcium Assessment Calcium Intake  # of servings/day  Calcium mg  Milk (8 oz) 0.5  x  300  = 150mg   Yogurt (4 oz) 1 x  200 = 200mg   Cheese (1 oz) 0 x  200 = 0  Other Calcium sources   250mg   Ca supplement MVI = 400mg    Estimated calcium intake per day 1000mg     DEXA Results Date of Test T-Score for AP Spine L1-L4 T-Score for Total Left Hip T-Score for Total Right Hip  04/15/2015 -4.9 -4.2 -4.2                  Assessment: Osteoporosis with history of knee cap fracture   Recommendations: 1. Administered Prolia 60mg  SQ in left upper arm 2.  Continue calcium to 1200mg  daily through supplementation or diet.  3.  continue  weight bearing exercise - 30 minutes at least 4 days per week.   4.  Counseled and educated about fall risk and prevention.  Recheck DEXA:  2 years  Time spent counseling patient:  20 minutes   Eileen Pastorammy Denetria Kidd, PharmD, CPP

## 2015-12-16 ENCOUNTER — Telehealth: Payer: Self-pay | Admitting: Family Medicine

## 2015-12-16 NOTE — Telephone Encounter (Signed)
Patient wanted to see if she could get Prolia injection when she sees Dr Museum/gallery exhibitions officerDettinger in July.   Last dose was 08/31/15 - explained that can only adminster q 6 months. No due until 02/2016.

## 2015-12-23 ENCOUNTER — Ambulatory Visit: Payer: Medicare Other | Admitting: Family Medicine

## 2015-12-23 ENCOUNTER — Ambulatory Visit (INDEPENDENT_AMBULATORY_CARE_PROVIDER_SITE_OTHER): Payer: Medicare Other | Admitting: Family Medicine

## 2015-12-23 ENCOUNTER — Encounter: Payer: Self-pay | Admitting: Family Medicine

## 2015-12-23 VITALS — BP 138/72 | HR 72 | Temp 97.6°F | Ht 61.5 in | Wt 98.2 lb

## 2015-12-23 DIAGNOSIS — R519 Headache, unspecified: Secondary | ICD-10-CM

## 2015-12-23 DIAGNOSIS — R51 Headache: Secondary | ICD-10-CM

## 2015-12-23 DIAGNOSIS — H9313 Tinnitus, bilateral: Secondary | ICD-10-CM | POA: Diagnosis not present

## 2015-12-23 NOTE — Progress Notes (Signed)
BP 138/72 mmHg  Pulse 72  Temp(Src) 97.6 F (36.4 C) (Oral)  Ht 5' 1.5" (1.562 m)  Wt 98 lb 3.2 oz (44.543 kg)  BMI 18.26 kg/m2   Subjective:    Patient ID: Eileen Kidd, female    DOB: 11/21/1940, 75 y.o.   MRN: [id number]  HPI: Eileen Kidd is a 75 y.o. female presenting on 12/23/2015 for Tinnitus   HPI Headache and ringing in ears Patient has been having a headache and ringing or years since been going on for the past couple weeks since she bumped her head against a counter. The ringing in the ears is on both ears evenly and has been increasing over the past few days. The headaches are mostly in the frontal region worse on the right than the left. The headache is described as a tight squeezing. She denies any visual deficits or focal numbness or weakness or changes in taste or smell. Besides the rain and the headache she does not have any other symptoms from this.  Relevant past medical, surgical, family and social history reviewed and updated as indicated. Interim medical history since our last visit reviewed. Allergies and medications reviewed and updated.  Review of Systems  Constitutional: Negative for fever and chills.  HENT: Positive for tinnitus. Negative for congestion, ear discharge and ear pain.   Eyes: Negative for redness and visual disturbance.  Respiratory: Negative for chest tightness and shortness of breath.   Cardiovascular: Negative for chest pain and leg swelling.  Genitourinary: Negative for dysuria and difficulty urinating.  Musculoskeletal: Negative for back pain and gait problem.  Skin: Negative for rash.  Neurological: Positive for headaches. Negative for dizziness and light-headedness.  Psychiatric/Behavioral: Negative for behavioral problems and agitation.  All other systems reviewed and are negative.   Per HPI unless specifically indicated above     Medication List       This list is accurate as of: 12/23/15  2:39 PM.  Always use your most  recent med list.               Fish Oil 1000 MG Caps  Take 1,000 mg by mouth 2 (two) times daily.     MULTIVITAMIN & MINERAL PO  Take by mouth.           Objective:    BP 138/72 mmHg  Pulse 72  Temp(Src) 97.6 F (36.4 C) (Oral)  Ht 5' 1.5" (1.562 m)  Wt 98 lb 3.2 oz (44.543 kg)  BMI 18.26 kg/m2  Wt Readings from Last 3 Encounters:  12/23/15 98 lb 3.2 oz (44.543 kg)  07/24/15 103 lb (46.72 kg)  04/16/15 100 lb 8 oz (45.587 kg)    Physical Exam  Constitutional: She is oriented to person, place, and time. She appears well-developed and well-nourished. No distress.  HENT:  Right Ear: External ear normal.  Nose: Nose normal.  Mouth/Throat: Oropharynx is clear and moist. No oropharyngeal exudate.  Eyes: Conjunctivae and EOM are normal. Pupils are equal, round, and reactive to light.  Neck: Neck supple. No thyromegaly present.  Cardiovascular: Normal rate, regular rhythm, normal heart sounds and intact distal pulses.   No murmur heard. Pulmonary/Chest: Effort normal and breath sounds normal. No respiratory distress. She has no wheezes.  Musculoskeletal: Normal range of motion. She exhibits no edema or tenderness (No tenderness upon palpation of head).  Lymphadenopathy:    She has no cervical adenopathy.  Neurological: She is alert and oriented to person, place, and time. She  displays normal reflexes. No cranial nerve deficit. She exhibits normal muscle tone. Coordination normal.  Skin: Skin is warm and dry. No rash noted. She is not diaphoretic.  Psychiatric: She has a normal mood and affect. Her behavior is normal.  Nursing note and vitals reviewed.     Assessment & Plan:   Problem List Items Addressed This Visit    None    Visit Diagnoses    Acute nonintractable headache, unspecified headache type    -  Primary    Headache and ringing in ears developed 2 weeks ago when she hit her head on a cabinet. If not improved to this point. Recommended ibuprofen for a week  BID    Ringing in ears, bilateral            Follow up plan: Return if symptoms worsen or fail to improve.  Counseling provided for all of the vaccine components No orders of the defined types were placed in this encounter.    Arville CareJoshua Dettinger, MD [facility] 12/23/2015, 2:39 PM

## 2016-01-01 ENCOUNTER — Ambulatory Visit (INDEPENDENT_AMBULATORY_CARE_PROVIDER_SITE_OTHER): Payer: Medicare Other | Admitting: Physician Assistant

## 2016-01-01 ENCOUNTER — Encounter: Payer: Self-pay | Admitting: Physician Assistant

## 2016-01-01 VITALS — BP 135/73 | HR 74 | Temp 97.2°F | Ht 61.5 in | Wt 102.0 lb

## 2016-01-01 DIAGNOSIS — H9311 Tinnitus, right ear: Secondary | ICD-10-CM | POA: Diagnosis not present

## 2016-01-01 MED ORDER — AMOXICILLIN 875 MG PO TABS: 875.0000 mg | ORAL_TABLET | Freq: Two times a day (BID) | ORAL | Status: DC

## 2016-01-01 NOTE — Patient Instructions (Signed)
Tinnitus  Tinnitus refers to hearing a sound when there is no actual source for that sound. This is often described as ringing in the ears. However, people with this condition may hear a variety of noises. A person may hear the sound in one ear or in both ears.   The sounds of tinnitus can be soft, loud, or somewhere in between. Tinnitus can last for a few seconds or can be constant for days. It may go away without treatment and come back at various times. When tinnitus is constant or happens often, it can lead to other problems, such as trouble sleeping and trouble concentrating.  Almost everyone experiences tinnitus at some point. Tinnitus that is long-lasting (chronic) or comes back often is a problem that may require medical attention.   CAUSES   The cause of tinnitus is often not known. In some cases, it can result from other problems or conditions, including:   · Exposure to loud noises from machinery, music, or other sources.  · Hearing loss.  · Ear or sinus infections.  · Earwax buildup.  · A foreign object in the ear.  · Use of certain medicines.  · Use of alcohol and caffeine.  · High blood pressure.  · Heart diseases.  · Anemia.  · Allergies.  · Meniere disease.  · Thyroid problems.  · Tumors.  · An enlarged part of a weakened blood vessel (aneurysm).  SYMPTOMS  The main symptom of tinnitus is hearing a sound when there is no source for that sound. It may sound like:   · Buzzing.  · Roaring.  · Ringing.  · Blowing air, similar to the sound heard when you listen to a seashell.  · Hissing.  · Whistling.  · Sizzling.  · Humming.  · Running water.  · A sustained musical note.  DIAGNOSIS   Tinnitus is diagnosed based on your symptoms. Your health care provider will do a physical exam. A comprehensive hearing exam (audiologic exam) will be done if your tinnitus:   · Affects only one ear (unilateral).  · Causes hearing difficulties.  · Lasts 6 months or longer.  You may also need to see a health care provider  who specializes in hearing disorders (audiologist). You may be asked to complete a questionnaire to determine the severity of your tinnitus. Tests may be done to help determine the cause and to rule out other conditions. These can include:  · Imaging studies of your head and brain, such as:    A CT scan.    An MRI.  · An imaging study of your blood vessels (angiogram).  TREATMENT   Treating an underlying medical condition can sometimes make tinnitus go away. If your tinnitus continues, other treatments may include:  · Medicines, such as certain antidepressants or sleeping aids.  · Sound generators to mask the tinnitus. These include:  ¨ Tabletop sound machines that play relaxing sounds to help you fall asleep.  ¨ Wearable devices that fit in your ear and play sounds or music.  ¨ A small device that uses headphones to deliver a signal embedded in music (acoustic neural stimulation). In time, this may change the pathways of your brain and make you less sensitive to tinnitus. This device is used for very severe cases when no other treatment is working.  · Therapy and counseling to help you manage the stress of living with tinnitus.  · Using hearing aids or cochlear implants, if your tinnitus is related to hearing   loss.  HOME CARE INSTRUCTIONS  · When possible, avoid being in loud places and being exposed to loud sounds.  · Wear hearing protection, such as earplugs, when you are exposed to loud noises.  · Do not take stimulants, such as nicotine, alcohol, or caffeine.  · Practice techniques for reducing stress, such as meditation, yoga, or deep breathing.  · Use a white noise machine, a humidifier, or other devices to mask the sound of tinnitus.  · Sleep with your head slightly raised. This may reduce the impact of tinnitus.  · Try to get plenty of rest each night.  SEEK MEDICAL CARE IF:  · You have tinnitus in just one ear.  · Your tinnitus continues for 3 weeks or longer without stopping.  · Home care measures are not  helping.  · You have tinnitus after a head injury.  · You have tinnitus along with any of the following:    Dizziness.    Loss of balance.    Nausea and vomiting.     This information is not intended to replace advice given to you by your health care provider. Make sure you discuss any questions you have with your health care provider.     Document Released: 06/13/2005 Document Revised: 07/04/2014 Document Reviewed: 11/13/2013  Elsevier Interactive Patient Education ©2016 [employer]

## 2016-01-01 NOTE — Progress Notes (Signed)
Subjective:     Patient ID: Eileen EthMonna R Kidd, female   DOB: [birth year], 75 y.o.   MRN: [id number]  HPI Pt with ringing to the R ear x 1 week   Review of Systems  Constitutional: Negative for fever and chills.  HENT: Negative for congestion, ear discharge, ear pain, hearing loss, postnasal drip, rhinorrhea, sinus pressure, sneezing and sore throat.        Objective:   Physical Exam  Constitutional: She appears well-developed and well-nourished.  HENT:  Right Ear: External ear normal.  Left Ear: External ear normal.  Mouth/Throat: Oropharynx is clear and moist. No oropharyngeal exudate.  TM's with nl landmarks Decrease hearing bilat with hearing screen Tympanogram nl on the L  And flattened and on the R  Nursing note and vitals reviewed.      Assessment:     1. Tinnitus, right        Plan:     Questions possible infection given tympanogram and sudden onset Trial of Amox 875mg  bid x 10 days If sx continue will have her refer to ENT for further eval Continue al other meds

## 2016-01-11 ENCOUNTER — Ambulatory Visit (INDEPENDENT_AMBULATORY_CARE_PROVIDER_SITE_OTHER): Payer: Medicare Other | Admitting: Family Medicine

## 2016-01-11 ENCOUNTER — Encounter: Payer: Self-pay | Admitting: Family Medicine

## 2016-01-11 VITALS — BP 138/68 | HR 69 | Temp 98.0°F | Ht 61.5 in | Wt 102.8 lb

## 2016-01-11 DIAGNOSIS — H9313 Tinnitus, bilateral: Secondary | ICD-10-CM | POA: Diagnosis not present

## 2016-01-11 NOTE — Progress Notes (Signed)
BP 138/68 mmHg  Pulse 69  Temp(Src) 98 F (36.7 C) (Oral)  Ht 5' 1.5" (1.562 m)  Wt 102 lb 12.8 oz (46.63 kg)  BMI 19.11 kg/m2   Subjective:    Patient ID: Eileen Kidd, female    DOB: [birth year], 75 y.o.   MRN: [id number]  HPI: SHAMECKA HOCUTT is a 75 y.o. female presenting on 01/11/2016 for Follow-up   HPI Tinnitus Patient comes in today for recheck on tinnitus. She says it is improved greatly with antibiotic that she was on and although still present and patient would like to wait for now on seeing ENT and will give [place] a call in a month and see if better or not and decide at that point  Relevant past medical, surgical, family and social history reviewed and updated as indicated. Interim medical history since our last visit reviewed. Allergies and medications reviewed and updated.  Review of Systems  Constitutional: Negative for fever and chills.  HENT: Positive for tinnitus. Negative for congestion, ear discharge, ear pain and sore throat.   Eyes: Negative for redness and visual disturbance.  Respiratory: Negative for chest tightness and shortness of breath.   Cardiovascular: Negative for chest pain and leg swelling.  Genitourinary: Negative for dysuria and difficulty urinating.  Musculoskeletal: Negative for back pain and gait problem.  Skin: Negative for rash.  Neurological: Negative for light-headedness and headaches.  Psychiatric/Behavioral: Negative for behavioral problems and agitation.  All other systems reviewed and are negative.   Per HPI unless specifically indicated above     Medication List       This list is accurate as of: 01/11/16  2:02 PM.  Always use your most recent med list.               Fish Oil 1000 MG Caps  Take 1,000 mg by mouth 2 (two) times daily.     MULTIVITAMIN & MINERAL PO  Take by mouth.           Objective:    BP 138/68 mmHg  Pulse 69  Temp(Src) 98 F (36.7 C) (Oral)  Ht 5' 1.5" (1.562 m)  Wt 102 lb 12.8 oz (46.63  kg)  BMI 19.11 kg/m2  Wt Readings from Last 3 Encounters:  01/11/16 102 lb 12.8 oz (46.63 kg)  01/01/16 102 lb (46.267 kg)  12/23/15 98 lb 3.2 oz (44.543 kg)    Physical Exam  Constitutional: She is oriented to person, place, and time. She appears well-developed and well-nourished. No distress.  Eyes: Conjunctivae and EOM are normal. Pupils are equal, round, and reactive to light.  Neck: Neck supple. No thyromegaly present.  Cardiovascular: Normal rate, regular rhythm, normal heart sounds and intact distal pulses.   No murmur heard. Pulmonary/Chest: Effort normal and breath sounds normal. No respiratory distress. She has no wheezes.  Musculoskeletal: Normal range of motion. She exhibits no edema or tenderness.  Lymphadenopathy:    She has no cervical adenopathy.  Neurological: She is alert and oriented to person, place, and time. Coordination normal.  Skin: Skin is warm and dry. No rash noted. She is not diaphoretic.  Psychiatric: She has a normal mood and affect. Her behavior is normal.  Nursing note and vitals reviewed.     Assessment & Plan:   Problem List Items Addressed This Visit    None    Visit Diagnoses    Tinnitus of both ears    -  Primary       Follow  up plan: Return if symptoms worsen or fail to improve.  Counseling provided for all of the vaccine components No orders of the defined types were placed in this encounter.    Arville CareJoshua Dettinger, MD [facility] 01/11/2016, 2:02 PM

## 2016-01-21 ENCOUNTER — Ambulatory Visit (INDEPENDENT_AMBULATORY_CARE_PROVIDER_SITE_OTHER): Payer: Medicare Other | Admitting: Family Medicine

## 2016-01-21 ENCOUNTER — Encounter: Payer: Self-pay | Admitting: Family Medicine

## 2016-01-21 ENCOUNTER — Telehealth: Payer: Self-pay | Admitting: Family Medicine

## 2016-01-21 VITALS — BP 123/65 | HR 73 | Temp 97.5°F | Ht 61.5 in | Wt 100.3 lb

## 2016-01-21 DIAGNOSIS — E785 Hyperlipidemia, unspecified: Secondary | ICD-10-CM | POA: Diagnosis not present

## 2016-01-21 DIAGNOSIS — H9311 Tinnitus, right ear: Secondary | ICD-10-CM | POA: Diagnosis not present

## 2016-01-21 NOTE — Telephone Encounter (Signed)
Tried to return call. One phone number had a Hydrologist box. Other stated disconnected

## 2016-01-21 NOTE — Progress Notes (Signed)
BP 123/65 (BP Location: Right Arm, Patient Position: Sitting, Cuff Size: Normal)   Pulse 73   Temp 97.5 F (36.4 C) (Oral)   Ht 5' 1.5" (1.562 m)   Wt 100 lb 4.8 oz (45.5 kg)   BMI 18.64 kg/m    Subjective:    Patient ID: Eileen Kidd, female    DOB: 1940-11-04, 75 y.o.   MRN: [id number]  HPI: Eileen Kidd is a 75 y.o. female presenting on 01/21/2016 for Follow-up (6 month) and Tinnitus (right ear)   HPI Dyslipidemia follow-up Taking fish oil for her dyslipidemia and denies any issues with her medication. She is due for recheck of her labs today. Patient denies headaches, blurred vision, chest pains, shortness of breath, or weakness. Denies any side effects from medication and is content with current medication.   Tinnitus Patient has been having ringing in her right ear more than her left but mainly when she is at home in her house. She does not have it when she is out and about and she says it is not there currently. She denies any fevers or chills or sinus congestion or pressure or ear pain. She just has the ringing. She does not have it anywhere outside of the house as of yet.  Relevant past medical, surgical, family and social history reviewed and updated as indicated. Interim medical history since our last visit reviewed. Allergies and medications reviewed and updated.  Review of Systems  Constitutional: Negative for chills and fever.  HENT: Positive for tinnitus. Negative for congestion, ear discharge and ear pain.   Eyes: Negative for redness and visual disturbance.  Respiratory: Negative for chest tightness and shortness of breath.   Cardiovascular: Negative for chest pain and leg swelling.  Genitourinary: Negative for difficulty urinating and dysuria.  Musculoskeletal: Negative for back pain and gait problem.  Skin: Negative for rash.  Neurological: Negative for light-headedness and headaches.  Psychiatric/Behavioral: Negative for agitation and behavioral problems.   All other systems reviewed and are negative.   Per HPI unless specifically indicated above     Medication List       Accurate as of 01/21/16  8:25 AM. Always use your most recent med list.          denosumab 60 MG/ML Soln injection Commonly known as:  PROLIA Inject 60 mg into the skin every 6 (six) months. Administer in upper arm, thigh, or abdomen   Fish Oil 1000 MG Caps Take 1,000 mg by mouth 2 (two) times daily.   MULTIVITAMIN & MINERAL PO Take by mouth.          Objective:    BP 123/65 (BP Location: Right Arm, Patient Position: Sitting, Cuff Size: Normal)   Pulse 73   Temp 97.5 F (36.4 C) (Oral)   Ht 5' 1.5" (1.562 m)   Wt 100 lb 4.8 oz (45.5 kg)   BMI 18.64 kg/m   Wt Readings from Last 3 Encounters:  01/21/16 100 lb 4.8 oz (45.5 kg)  01/11/16 102 lb 12.8 oz (46.6 kg)  01/01/16 102 lb (46.3 kg)    Physical Exam  Constitutional: She is oriented to person, place, and time. She appears well-developed and well-nourished. No distress.  HENT:  Right Ear: Tympanic membrane, external ear and ear canal normal.  Left Ear: Tympanic membrane, external ear and ear canal normal.  Nose: Nose normal.  Mouth/Throat: Oropharynx is clear and moist. No oropharyngeal exudate.  Eyes: Conjunctivae and EOM are normal. Pupils are equal, round,  and reactive to light.  Neck: Neck supple. No thyromegaly present.  Cardiovascular: Normal rate, regular rhythm, normal heart sounds and intact distal pulses.   No murmur heard. Pulmonary/Chest: Effort normal and breath sounds normal. No respiratory distress. She has no wheezes.  Abdominal: Soft. Bowel sounds are normal. She exhibits no distension. There is no tenderness. There is no rebound.  Musculoskeletal: Normal range of motion. She exhibits no edema or tenderness.  Lymphadenopathy:    She has no cervical adenopathy.  Neurological: She is alert and oriented to person, place, and time. Coordination normal.  Skin: Skin is warm and  dry. No rash noted. She is not diaphoretic.  Psychiatric: She has a normal mood and affect. Her behavior is normal.  Nursing note and vitals reviewed.     Assessment & Plan:   Problem List Items Addressed This Visit      Other   Dyslipidemia   Relevant Orders   Lipid panel    Other Visit Diagnoses    Tinnitus, right    -  Primary   Relevant Orders   CBC with Differential/Platelet   CMP14+EGFR       Follow up plan: Return in about 6 months (around 07/23/2016), or if symptoms worsen or fail to improve, for Recheck labs.  Counseling provided for all of the vaccine components Orders Placed This Encounter  Procedures  . CBC with Differential/Platelet  . Lipid panel  . [place], MD [facility] 01/21/2016, 8:25 AM

## 2016-01-21 NOTE — Telephone Encounter (Signed)
Patient had called to see if she had left her appointment card here but she has found it and knows when her next appointment is.  No further help needed.

## 2016-01-21 NOTE — Telephone Encounter (Signed)
Return called to pt. She asked when her labs would be back. Then she only wanted to ask Dr. Darrol Poke nurse, Jan her questions.

## 2016-01-22 LAB — CBC WITH DIFFERENTIAL/PLATELET
BASOS: 1 %
Basophils Absolute: 0.1 10*3/uL (ref 0.0–0.2)
EOS (ABSOLUTE): 0.1 10*3/uL (ref 0.0–0.4)
EOS: 1 %
HEMATOCRIT: 40.7 % (ref 34.0–46.6)
HEMOGLOBIN: 13.4 g/dL (ref 11.1–15.9)
IMMATURE GRANULOCYTES: 0 %
Immature Grans (Abs): 0 10*3/uL (ref 0.0–0.1)
Lymphocytes Absolute: 2.5 10*3/uL (ref 0.7–3.1)
Lymphs: 36 %
MCH: 30.8 pg (ref 26.6–33.0)
MCHC: 32.9 g/dL (ref 31.5–35.7)
MCV: 94 fL (ref 79–97)
MONOCYTES: 9 %
MONOS ABS: 0.6 10*3/uL (ref 0.1–0.9)
NEUTROS PCT: 53 %
Neutrophils Absolute: 3.7 10*3/uL (ref 1.4–7.0)
Platelets: 333 10*3/uL (ref 150–379)
RBC: 4.35 x10E6/uL (ref 3.77–5.28)
RDW: 14.4 % (ref 12.3–15.4)
WBC: 6.9 10*3/uL (ref 3.4–10.8)

## 2016-01-22 LAB — CMP14+EGFR
A/G RATIO: 2 (ref 1.2–2.2)
ALBUMIN: 4.3 g/dL (ref 3.5–4.8)
ALK PHOS: 43 IU/L (ref 39–117)
ALT: 16 IU/L (ref 0–32)
AST: 20 IU/L (ref 0–40)
BILIRUBIN TOTAL: 0.6 mg/dL (ref 0.0–1.2)
BUN / CREAT RATIO: 18 (ref 12–28)
BUN: 12 mg/dL (ref 8–27)
CHLORIDE: 100 mmol/L (ref 96–106)
CO2: 25 mmol/L (ref 18–29)
Calcium: 9.8 mg/dL (ref 8.7–10.3)
Creatinine, Ser: 0.68 mg/dL (ref 0.57–1.00)
GFR calc non Af Amer: 85 mL/min/{1.73_m2} (ref >59)
GFR, EST AFRICAN AMERICAN: 98 mL/min/{1.73_m2} (ref >59)
GLUCOSE: 97 mg/dL (ref 65–99)
Globulin, Total: 2.2 g/dL (ref 1.5–4.5)
POTASSIUM: 4.1 mmol/L (ref 3.5–5.2)
SODIUM: 141 mmol/L (ref 134–144)
TOTAL PROTEIN: 6.5 g/dL (ref 6.0–8.5)

## 2016-01-22 LAB — LIPID PANEL
Chol/HDL Ratio: 3.8 ratio units (ref 0.0–4.4)
Cholesterol, Total: 292 mg/dL — ABNORMAL HIGH (ref 100–199)
HDL: 76 mg/dL (ref >39)
LDL Calculated: 201 mg/dL — ABNORMAL HIGH (ref 0–99)
TRIGLYCERIDES: 77 mg/dL (ref 0–149)
VLDL CHOLESTEROL CAL: 15 mg/dL (ref 5–40)

## 2016-01-25 MED ORDER — ATORVASTATIN CALCIUM 40 MG PO TABS: 40.0000 mg | ORAL_TABLET | Freq: Every day | ORAL | 1 refills | Status: DC

## 2016-01-25 NOTE — Addendum Note (Signed)
Addended by: Angela Adam on: 01/25/2016 09:27 AM   Modules accepted: Orders

## 2016-02-10 ENCOUNTER — Telehealth: Payer: Self-pay | Admitting: Family Medicine

## 2016-02-10 MED ORDER — ROSUVASTATIN CALCIUM 20 MG PO TABS: 20.0000 mg | ORAL_TABLET | Freq: Every day | ORAL | 3 refills | Status: DC

## 2016-02-10 NOTE — Telephone Encounter (Signed)
Pt aware.

## 2016-02-10 NOTE — Telephone Encounter (Signed)
Spoke with pt and she states her legs are weaker since starting the atorvastatin and she is also having trouble sleeping at night. She states she only gets "cat naps" at night and wanted to know if she could get a lower dose of the atorvastatin or try a different medication. Please advise.

## 2016-02-18 ENCOUNTER — Telehealth: Payer: Self-pay | Admitting: Family Medicine

## 2016-02-18 NOTE — Telephone Encounter (Signed)
Patient has questions about food and elevated LDL.  Answered questions.  Also patient is due Prolia in about 2 weeks.  Appointment made.

## 2016-03-02 ENCOUNTER — Telehealth: Payer: Self-pay | Admitting: Family Medicine

## 2016-03-02 ENCOUNTER — Ambulatory Visit: Payer: Self-pay | Admitting: Pharmacist

## 2016-03-02 NOTE — Telephone Encounter (Signed)
Patient called to see if she can eat in the morning before appt - advised not to eat as we are going to check lipids tomorrow morning.  Patient voiced understanding.

## 2016-03-03 ENCOUNTER — Encounter: Payer: Self-pay | Admitting: Pharmacist

## 2016-03-03 ENCOUNTER — Ambulatory Visit (INDEPENDENT_AMBULATORY_CARE_PROVIDER_SITE_OTHER): Payer: Medicare Other | Admitting: Pharmacist

## 2016-03-03 VITALS — BP 140/70 | HR 78 | Ht 61.5 in | Wt 101.0 lb

## 2016-03-03 DIAGNOSIS — M810 Age-related osteoporosis without current pathological fracture: Secondary | ICD-10-CM | POA: Diagnosis not present

## 2016-03-03 DIAGNOSIS — E785 Hyperlipidemia, unspecified: Secondary | ICD-10-CM

## 2016-03-03 MED ORDER — DENOSUMAB 60 MG/ML ~~LOC~~ SOLN
60.0000 mg | Freq: Once | SUBCUTANEOUS | Status: AC
  Administered 2016-03-03: 60 mg via SUBCUTANEOUS

## 2016-03-03 NOTE — Progress Notes (Signed)
Patient ID: Eileen Kidd, female   DOB: [birth year], 75 y.o.   MRN: [id number]  CC: osteoporosis and hyperlipidemia  HPI: Osteoporosis.  She received her first Prolia inject 6 months ago.  She has tolerated well.  She tried alendronate last year but after 4 doses she experienced GI problems and unsteady gait which she felt was related to alendronate.   Back Pain?  No       Kyphosis?  No Prior fracture?  Yes; knee cap (caused by fall) and rib fracture  Hyperlipidemia - started rosovustatin 20mg  daily for elevated lipids about 6 weeks ago.  She is tolerating well.  She tried atorvastatin in past but stopped due to leg weakness.                                                               PMH: Age at menopause:  [age]'s Hysterectomy?  No Oophorectomy?  No HRT? No Steroid Use?  No Thyroid med?  No History of cancer?  No History of digestive disorders (ie Crohn's)?  No Current or previous eating disorders?  No Last Vitamin D Result:  36.8 (04/16/2015) Last GFR Result:  85 (01/21/2016)   FH/SH: Family history of osteoporosis?  No Parent with history of hip fracture?  No Family history of breast cancer?  Yes - daughter Exercise?  Yes - walking and leg lifts Alcohol?  No    Calcium Assessment Calcium Intake  # of servings/day  Calcium mg  Milk (8 oz) 0.5  x  300  = 150mg   Yogurt (4 oz) 1 x  200 = 200mg   Cheese (1 oz) 1 x  200 = 200 mg  Other Calcium sources   250mg   Ca supplement MVI = 400mg    Estimated calcium intake per day 1200mg     DEXA Results Date of Test T-Score for AP Spine L1-L4 T-Score for Total Left Hip T-Score for Total Right Hip  04/15/2015 -4.9 -4.2 -4.2                  Lipid Panel     Component Value Date/Time   CHOL 292 (H) 01/21/2016 0829   TRIG 77 01/21/2016 0829   TRIG 83 06/24/2013 1459   HDL 76 01/21/2016 0829   HDL 81 06/24/2013 1459   CHOLHDL 3.8 01/21/2016 0829   LDLCALC 201 (H) 01/21/2016 0829   LDLCALC 170 (H) 06/24/2013 1459     Assessment: Osteoporosis with history of knee cap fracture  hyperlipidemia  Recommendations: 1. Administered Prolia 60mg  SQ in right upper arm - second dose 2.  Continue calcium to 1200mg  daily through supplementation or diet.  3.  continue weight bearing exercise - 30 minutes at least 4 days per week.   4.  Continue rosuvastatin 20mg  daily. Checking lipids and LFTs today.   5.  Discussed how to read nutrition label to determine about of fat and cholesterol.  Also discussed ways to limit fat and cholesterol in diet.  Recheck DEXA:  03/2017  Time spent counseling patient:  30 minutes   Eileen Kidd, PharmD, CPP

## 2016-03-03 NOTE — Patient Instructions (Signed)
Reading Food Labels Foods that are packaged or in containers have a Nutrition Facts panel on the side or back. This is commonly called the food label. The food label helps you make healthy food choices by providing information about serving size and the amount of calories and various nutrients in the food. You can check the food label to find out if the food contains high or low amounts of nutrients that you want to limit in your diet. You can also use the food label to see if the food is a good source of the nutrients that you want to make sure are included in your diet. HOW DO I READ THE FOOD LABEL? Begin by checking the serving size and number of servings in the container. All of the nutrition information listed on the food label is based on one serving. If you eat more than one serving, you must multiply the amounts (such as calories, grams of saturated fat, or milligrams of sodium) by the number of servings. Check the calories. Choosing foods that are low in calories can help you manage your weight. Look at the numbers in the % Daily Value column for each listed nutrient. This gives you an idea of how much of the daily recommended amount for that nutrient is provided in one serving of the food. A daily value of 5% or less is considered low. A daily value of 20% or more is considered high. Check the amounts for the items you should limit in your diet. These include: Total fat. Saturated fat. Trans fat. Cholesterol. Sodium. Check the amounts for the items you should make sure you get enough of. These include: Dietary fiber. Vitamins A and C. Calcium. Iron. WHAT INFORMATION IS PROVIDED ON THE FOOD LABEL? Serving Information Serving size. The serving size is listed in cups or pieces. The nutrient amounts listed on the food label apply to this amount of the food. Servings per container or package. This shows the number of servings you can expect to get from the container or package if you  follow the suggested serving size. Amount Per Serving Calories. The number of calories in one serving of the food. This information is helpful in managing weight. Low-calorie foods contain 40 calories or less. High-calorie foods contain 400 or more calories. Calories from fat. The number of calories that come from fat in one serving. Percent Daily Value Percent Daily Value (shown on the label as % Daily Value) tells you what percent of the daily value for each nutrient one serving provides. The daily value is the recommended amount of the nutrient that you should get each day. For example, if 15% is listed next to dietary fiber, it means that one serving of the food will give you 15% of the recommended amount of fiber that you should get in a day. The daily values are based on a 2,000-calorie-per-day diet. You may get more or less than 2,000 calories in your diet each day, but the % Daily Value gives you an idea of whether the food contains a high or low amount of the listed nutrient. A daily value of 5% or less is low. A daily value of 20% or more is high. Total Fat Total fat shows you the total amount of fat in one serving (listed in grams). Foods with high amounts of fat usually have higher calories and may lead to weight gain. Two of the fats that make up a portion of the total fat are included on the  label: Saturated fat. This number is the amount of saturated fat in one serving (listed in grams). Saturated fat increases the amount of blood cholesterol and should be limited to less than 7% of total calories each day. This means that if you eat 2,000 calories each day, you should eat less than 140 calories from saturated fat.  Trans fat. This number is the amount of trans fat in one serving (listed in grams). Trans fat is the most unhealthy fat for heart health and should be limited to as little as possible (less than 2 grams per day). Cholesterol The amount of cholesterol in one serving is  listed in milligrams. Cholesterol should be limited to no more than 300 mg each day. Sodium The amount of sodium in one serving is listed in milligrams. Most people should limit their sodium intake to 2,300 mg a day. Total Carbohydrate This number shows the amount of total carbohydrate in one serving (listed in grams). This information can help people with diabetes manage the amount of carbohydrate they eat. Two of the carbohydrates that make up a portion of the total carbohydrate are included on the label: Dietary fiber. The amount of dietary fiber in one serving is listed in grams. Most people should eat at least 25 g of dietary fiber each day.  Sugars. The amount of sugar in one serving is also listed in grams. This value includes both naturally occurring sugars from fruit and milk and added sugars such as honey or table sugar. Protein The amount of protein in one serving is listed in grams.  Vitamins and Minerals The % Daily Value is listed for vitamin A, vitamin C, calcium, and iron. Some food labels also list % Daily Values for some other vitamins or minerals.  WHAT OTHER IMPORTANT LABELING IS ON THE FOOD PACKAGE? Ingredients  Food labels will list each ingredient in the food. The first ingredient listed is the ingredient that the food has the most of. The ingredients are listed in the order of their amount by weight from highest to lowest. Food Allergen Labeling Food labels may also include a food allergen warning. Listed here are ingredients that can cause allergic reactions in some people. The potential allergens are listed behind the word "Contains" or "May contain." Examples of ingredients that may be listed are wheat, dairy, eggs, soy, and nuts. If a person knows that he or she is allergic to one of these ingredients, he or she will know to avoid that food. WHERE CAN I FIND MORE INFORMATION? U.S. Food and Drug Administration: PumpkinSearch.com.ee   This information is not intended to  replace advice given to you by your health care provider. Make sure you discuss any questions you have with your health care provider.   Document Released: 06/13/2005 Document Revised: 06/18/2013 Document Reviewed: 05/06/2013 Elsevier Interactive Patient Education 2016 Elsevier Inc.   Fat and Cholesterol Restricted Diet Getting too much fat and cholesterol in your diet may cause health problems. Following this diet helps keep your fat and cholesterol at normal levels. This can keep you from getting sick. WHAT TYPES OF FAT SHOULD I CHOOSE? Choose monosaturated and polyunsaturated fats. These are found in foods such as olive oil, canola oil, flaxseeds, walnuts, almonds, and seeds. Eat more omega-3 fats. Good choices include salmon, mackerel, sardines, tuna, flaxseed oil, and ground flaxseeds. Limit saturated fats. These are in animal products such as meats, butter, and cream. They can also be in plant products such as palm oil, palm kernel oil,  and coconut oil.  Avoid foods with partially hydrogenated oils in them. These contain trans fats. Examples of foods that have trans fats are stick margarine, some tub margarines, cookies, crackers, and other baked goods. WHAT GENERAL GUIDELINES DO I NEED TO FOLLOW?  Check food labels. Look for the words "trans fat" and "saturated fat." When preparing a meal: Fill half of your plate with vegetables and green salads. Fill one fourth of your plate with whole grains. Look for the word "whole" as the first word in the ingredient list. Fill one fourth of your plate with lean protein foods. Limit fruit to two servings a day. Choose fruit instead of juice. Eat more foods with soluble fiber. Examples of foods with this type of fiber are apples, broccoli, carrots, beans, peas, and barley. Try to get 20-30 g (grams) of fiber per day. Eat more home-cooked foods. Eat less at restaurants and buffets. Limit or avoid alcohol. Limit foods high in starch and sugar. Limit  fried foods. Cook foods without frying them. Baking, boiling, grilling, and broiling are all great options. Lose weight if you are overweight. Losing even a small amount of weight can help your overall health. It can also help prevent diseases such as diabetes and heart disease. WHAT FOODS CAN I EAT? Grains Whole grains, such as whole wheat or whole grain breads, crackers, cereals, and pasta. Unsweetened oatmeal, bulgur, barley, quinoa, or brown rice. Corn or whole wheat flour tortillas. Vegetables Fresh or frozen vegetables (raw, steamed, roasted, or grilled). Green salads. Fruits All fresh, canned (in natural juice), or frozen fruits. Meat and Other Protein Products Ground beef (85% or leaner), grass-fed beef, or beef trimmed of fat. Skinless chicken or [place]key. Ground chicken or [place]. Pork trimmed of fat. All fish and seafood. Eggs. Dried beans, peas, or lentils. Unsalted nuts or seeds. Unsalted canned or dry beans. Dairy Low-fat dairy products, such as skim or 1% milk, 2% or reduced-fat cheeses, low-fat ricotta or cottage cheese, or plain low-fat yogurt. Fats and Oils Tub margarines without trans fats. Light or reduced-fat mayonnaise and salad dressings. Avocado. Olive, canola, sesame, or safflower oils. Natural peanut or almond butter (choose ones without added sugar and oil). The items listed above may not be a complete list of recommended foods or beverages. Contact your dietitian for more options. WHAT FOODS ARE NOT RECOMMENDED? Grains White bread. White pasta. White rice. Cornbread. Bagels, pastries, and croissants. Crackers that contain trans fat. Vegetables White potatoes. Corn. Creamed or fried vegetables. Vegetables in a cheese sauce. Fruits Dried fruits. Canned fruit in light or heavy syrup. Fruit juice. Meat and Other Protein Products Fatty cuts of meat. Ribs, chicken wings, bacon, sausage, bologna, salami, chitterlings, fatback, hot dogs, bratwurst, and packaged luncheon  meats. Liver and organ meats. Dairy Whole or 2% milk, cream, half-and-half, and cream cheese. Whole milk cheeses. Whole-fat or sweetened yogurt. Full-fat cheeses. Nondairy creamers and whipped toppings. Processed cheese, cheese spreads, or cheese curds. Sweets and Desserts Corn syrup, sugars, honey, and molasses. Candy. Jam and jelly. Syrup. Sweetened cereals. Cookies, pies, cakes, donuts, muffins, and ice cream. Fats and Oils Butter, stick margarine, lard, shortening, ghee, or bacon fat. Coconut, palm kernel, or palm oils. Beverages Alcohol. Sweetened drinks (such as sodas, lemonade, and fruit drinks or punches). The items listed above may not be a complete list of foods and beverages to avoid. Contact your dietitian for more information.   This information is not intended to replace advice given to you by your health care provider.  Make sure you discuss any questions you have with your health care provider.   Document Released: 12/13/2011 Document Revised: 07/04/2014 Document Reviewed: 09/12/2013 Elsevier Interactive Patient Education [employer]

## 2016-03-04 LAB — HEPATIC FUNCTION PANEL
ALBUMIN: 4.5 g/dL (ref 3.5–4.8)
ALT: 15 IU/L (ref 0–32)
AST: 22 IU/L (ref 0–40)
Alkaline Phosphatase: 61 IU/L (ref 39–117)
BILIRUBIN TOTAL: 0.4 mg/dL (ref 0.0–1.2)
Bilirubin, Direct: 0.13 mg/dL (ref 0.00–0.40)
TOTAL PROTEIN: 6.7 g/dL (ref 6.0–8.5)

## 2016-03-04 LAB — LIPID PANEL
CHOLESTEROL TOTAL: 179 mg/dL (ref 100–199)
Chol/HDL Ratio: 2 ratio units (ref 0.0–4.4)
HDL: 90 mg/dL (ref >39)
LDL CALC: 69 mg/dL (ref 0–99)
Triglycerides: 98 mg/dL (ref 0–149)
VLDL CHOLESTEROL CAL: 20 mg/dL (ref 5–40)

## 2016-03-30 ENCOUNTER — Telehealth: Payer: Self-pay | Admitting: Family Medicine

## 2016-03-31 NOTE — Telephone Encounter (Signed)
Sent back to Dr Louanne Skyeettinger

## 2016-03-31 NOTE — Telephone Encounter (Signed)
Informed pt & appointment made for this coming Monday

## 2016-03-31 NOTE — Telephone Encounter (Signed)
Have her bring the paperwork in and we can do a functional assessment and discuss it more at that time

## 2016-03-31 NOTE — Telephone Encounter (Signed)
Patient requesting handicap placard.  I will forward to home health / Dr Dettinger as I an not sure what her diagnosis would be for this.  Asked patient about walking from parking lot to building and she states she sometimes needs her cane due to arthritis and knee pain.

## 2016-04-04 ENCOUNTER — Ambulatory Visit: Payer: Medicare Other | Admitting: Family Medicine

## 2016-04-04 DIAGNOSIS — Z029 Encounter for administrative examinations, unspecified: Secondary | ICD-10-CM

## 2016-05-03 ENCOUNTER — Telehealth: Payer: Self-pay | Admitting: Family Medicine

## 2016-05-04 NOTE — Telephone Encounter (Signed)
Patient wanted to let us know that she has flu vaccine administered at [facility] yesterday.

## 2016-07-25 ENCOUNTER — Ambulatory Visit: Payer: Medicare Other | Admitting: Family Medicine

## 2016-07-28 ENCOUNTER — Encounter: Payer: Self-pay | Admitting: Family Medicine

## 2016-07-28 ENCOUNTER — Ambulatory Visit (INDEPENDENT_AMBULATORY_CARE_PROVIDER_SITE_OTHER): Payer: Medicare Other | Admitting: Family Medicine

## 2016-07-28 VITALS — BP 131/74 | HR 89 | Temp 96.8°F | Ht 61.5 in | Wt 101.0 lb

## 2016-07-28 DIAGNOSIS — E785 Hyperlipidemia, unspecified: Secondary | ICD-10-CM | POA: Diagnosis not present

## 2016-07-28 NOTE — Progress Notes (Signed)
BP 131/74   Pulse 89   Temp (!) 96.8 F (36 C) (Oral)   Ht 5' 1.5" (1.562 m)   Wt 101 lb (45.8 kg)   BMI 18.77 kg/m    Subjective:    Patient ID: Eileen Kidd, female    DOB: September 19, 1940, 76 y.o.   MRN: [id number]  HPI: Eileen Kidd is a 76 y.o. female presenting on 07/28/2016 for Hyperlipidemia (pt here today for routine follow up on her cholesterol, no concerns voiced )   HPI Hyperlipidemia recheck Patient is coming in today for a cholesterol recheck. She has continued to take her Crestor and her fish oils. She denies any myalgias or issues with the medication. She says she is trying to read labels and avoid things that are high in cholesterol are high in fat. She is generally feeling well and denies any major health issues. Patient denies headaches, blurred vision, chest pains, shortness of breath, or weakness. Denies any side effects from medication and is content with current medication.   Relevant past medical, surgical, family and social history reviewed and updated as indicated. Interim medical history since our last visit reviewed. Allergies and medications reviewed and updated.  Review of Systems  Constitutional: Negative for chills and fever.  Respiratory: Negative for chest tightness and shortness of breath.   Cardiovascular: Negative for chest pain and leg swelling.  Genitourinary: Negative for difficulty urinating and dysuria.  Musculoskeletal: Negative for back pain and gait problem.  Skin: Negative for rash.  Neurological: Negative for dizziness, light-headedness and headaches.  Psychiatric/Behavioral: Negative for agitation and behavioral problems.  All other systems reviewed and are negative.   Per HPI unless specifically indicated above     Objective:    BP 131/74   Pulse 89   Temp (!) 96.8 F (36 C) (Oral)   Ht 5' 1.5" (1.562 m)   Wt 101 lb (45.8 kg)   BMI 18.77 kg/m   Wt Readings from Last 3 Encounters:  07/28/16 101 lb (45.8 kg)  03/03/16  101 lb (45.8 kg)  01/21/16 100 lb 4.8 oz (45.5 kg)    Physical Exam  Constitutional: She is oriented to person, place, and time. She appears well-developed and well-nourished. No distress.  Eyes: Conjunctivae are normal.  Neck: Neck supple. No thyromegaly present.  Cardiovascular: Normal rate, regular rhythm, normal heart sounds and intact distal pulses.   No murmur heard. Pulmonary/Chest: Effort normal and breath sounds normal. No respiratory distress. She has no wheezes. She has no rales.  Musculoskeletal: Normal range of motion. She exhibits no edema or tenderness.  Lymphadenopathy:    She has no cervical adenopathy.  Neurological: She is alert and oriented to person, place, and time. Coordination normal.  Skin: Skin is warm and dry. No rash noted. She is not diaphoretic.  Psychiatric: She has a normal mood and affect. Her behavior is normal.  Nursing note and vitals reviewed.     Assessment & Plan:   Problem List Items Addressed This Visit      Other   Dyslipidemia - Primary   Relevant Orders   Lipid panel   Lipid panel       Follow up plan: Return in about 6 months (around 01/25/2017), or if symptoms worsen or fail to improve, for Adult well exam and cholesterol.  Counseling provided for all of the vaccine components Orders Placed This Encounter  Procedures  . Lipid panel    Arville CareJoshua Antonique Langford, MD [facility] 07/28/2016, 8:31  AM     

## 2016-07-29 LAB — LIPID PANEL
CHOL/HDL RATIO: 2.1 ratio (ref 0.0–4.4)
Cholesterol, Total: 184 mg/dL (ref 100–199)
HDL: 87 mg/dL (ref >39)
LDL CALC: 85 mg/dL (ref 0–99)
TRIGLYCERIDES: 62 mg/dL (ref 0–149)
VLDL CHOLESTEROL CAL: 12 mg/dL (ref 5–40)

## 2016-08-29 ENCOUNTER — Telehealth: Payer: Self-pay | Admitting: Family Medicine

## 2016-09-01 ENCOUNTER — Ambulatory Visit (INDEPENDENT_AMBULATORY_CARE_PROVIDER_SITE_OTHER): Payer: Medicare Other | Admitting: Pharmacist

## 2016-09-01 ENCOUNTER — Ambulatory Visit: Payer: Self-pay | Admitting: Pharmacist

## 2016-09-01 ENCOUNTER — Encounter: Payer: Self-pay | Admitting: Pharmacist

## 2016-09-01 VITALS — BP 137/76 | HR 80 | Ht 61.0 in | Wt 102.0 lb

## 2016-09-01 DIAGNOSIS — M810 Age-related osteoporosis without current pathological fracture: Secondary | ICD-10-CM | POA: Diagnosis not present

## 2016-09-01 DIAGNOSIS — Z23 Encounter for immunization: Secondary | ICD-10-CM

## 2016-09-01 DIAGNOSIS — Z0000 Encounter for general adult medical examination without abnormal findings: Secondary | ICD-10-CM | POA: Diagnosis not present

## 2016-09-01 MED ORDER — DENOSUMAB 60 MG/ML ~~LOC~~ SOLN
60.0000 mg | Freq: Once | SUBCUTANEOUS | Status: AC
  Administered 2016-09-01: 60 mg via SUBCUTANEOUS

## 2016-09-01 NOTE — Addendum Note (Signed)
Addended by: Bearl MulberryUTHERFORD, Viraj Liby K on: 09/01/2016 12:05 PM   Modules accepted: Orders

## 2016-09-01 NOTE — Progress Notes (Signed)
Patient ID: TAMRE CASS, female   DOB: [birth year], 76 y.o.   MRN: [id number]     Subjective:   Eileen Kidd is a 76 y.o. female who presents for subsequent Medicare Annual Wellness Visit.  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   HENT: Negative.   Eyes: Negative.   Respiratory: Negative.   Cardiovascular: Negative.   Gastrointestinal: Negative.   Genitourinary: Negative.   Musculoskeletal: Negative.   Skin: Negative.   Neurological: Negative.   Endo/Heme/Allergies: Negative.   Psychiatric/Behavioral: Negative.     Current Medications (verified) Outpatient Encounter Prescriptions as of 09/01/2016  Medication Sig  . denosumab (PROLIA) 60 MG/ML SOLN injection Inject 60 mg into the skin every 6 (six) months. Administer in upper arm, thigh, or abdomen  . Multiple Vitamins-Minerals (MULTIVITAMIN & MINERAL PO) Take 1 tablet by mouth daily.   . Omega-3 Fatty Acids (FISH OIL) 1000 MG CAPS Take 1,000 mg by mouth 2 (two) times daily.  . rosuvastatin (CRESTOR) 20 MG tablet Take 1 tablet (20 mg total) by mouth daily.  . [EXPIRED] denosumab (PROLIA) injection 60 mg    No facility-administered encounter medications on file as of 09/01/2016.     Allergies (verified) Alendronate   History: Past Medical History:  Diagnosis Date  . Arthritis    left knee  . Endometriosis   . History of fractured kneecap    LEFT  . Hyperlipidemia   . Osteoporosis    Past Surgical History:  Procedure Laterality Date  . ABDOMINAL HYSTERECTOMY Bilateral   . SPINE SURGERY     L5-S1 removal   Family History  Problem Relation Age of Onset  . Heart  attack Mother   . Arthritis Mother   . Heart attack Father   . Diabetes Brother   . Stroke Brother   . Breast cancer Daughter    Social History   Occupational History  . Not on file.   Social History Main Topics  . Smoking status: Current Every Day Smoker    Packs/day: 0.25    Years: 40.00    Types: Cigarettes  . Smokeless tobacco: Never Used  . Alcohol use No  . Drug use: No  . Sexual activity: No    Do you feel safe at home?  Yes Are there smokers in your home (other than you)? No  Dietary issues and exercise activities: Current Exercise Habits: The patient does not participate in regular exercise at present (usually does stretches and lifts hand weights but has not done this in the last 2 months.)  Current Dietary habits:  Eats 3 meals per day and snacks several times a day on either yogurt, nabs or fruit. Avoids fried foods.  Also limits high fat foods due to elevated cholesterol.  Drinks mostly water.      Objective:    Today's Vitals   09/01/16 1127 09/01/16 1143  BP: (!) 144/78 137/76  Pulse: 80   Weight: 102 lb (46.3 kg)   Height: 5\' 1"  (1.549 m)   PainSc: 0-No pain    Body mass index is 19.27 kg/m.  Activities of Daily Living In your present state of health, do you have any difficulty performing the following  activities: 09/01/2016  Hearing? N  Vision? N  Difficulty concentrating or making decisions? N  Walking or climbing stairs? N  Dressing or bathing? N  Doing errands, shopping? N  Preparing Food and eating ? N  Using the Toilet? N  In the past six months, have you accidently leaked urine? N  Do you have problems with loss of bowel control? N  Managing your Medications? N  Managing your Finances? N  Housekeeping or managing your Housekeeping? N  Some recent data might be hidden     Cardiac Risk Factors include: advanced age (>40men, >97 women);dyslipidemia;sedentary lifestyle;smoking/ tobacco exposure  Depression Screen PHQ 2/9 Scores  09/01/2016 07/28/2016 01/21/2016 01/11/2016  PHQ - 2 Score 0 0 0 0     Fall Risk Fall Risk  09/01/2016 07/28/2016 01/21/2016 01/01/2016 12/23/2015  Falls in the past year? Yes No No No No  Number falls in past yr: - - - - -  Injury with Fall? - - - - -    Cognitive Function: MMSE - Mini Mental State Exam 09/01/2016 04/14/2015  Orientation to time 4 5  Orientation to Place 5 5  Registration 3 3  Attention/ Calculation 3 5  Recall 2 2  Language- name 2 objects 2 2  Language- repeat 1 1  Language- follow 3 step command 3 3  Language- read & follow direction 1 1  Write a sentence 1 1  Copy design 1 1  Total score 26 29    Immunizations and Health Maintenance Immunization History  Administered Date(s) Administered  . Influenza Split 04/03/2013  . Influenza, High Dose Seasonal PF 04/18/2014, 08/02/2015  . Influenza,inj,Quad PF,36+ Mos 04/14/2015   Health Maintenance Due  Topic Date Due  . TETANUS/TDAP  08/06/1957  . PNA vac Low Risk Adult (1 of 2 - PCV13) 08/07/2003    Patient Care Team: Nils Pyle, MD as PCP - General (Family Medicine)  Indicate any recent Medical Services you may have received from other than [facility] providers in the past year (date may be approximate).    Assessment:    Annual Wellness Visit  Osteoporosis   Screening Tests Health Maintenance  Topic Date Due  . TETANUS/TDAP  08/06/1957  . PNA vac Low Risk Adult (1 of 2 - PCV13) 08/07/2003  . DEXA SCAN  04/14/2017  . INFLUENZA VACCINE  Addressed        Plan:   During the course of the visit Eileen Kidd was educated and counseled about the following appropriate screening and preventive services:   Vaccines to include Pneumoccal, Influenza, Td, Zostavax - declined shingles vaccine today; patient received tdap today.  Will request paper chart to verify administration of Pneumonia vaccine in past  Colorectal cancer screening - past age that this is required  Cardiovascular disease screening - recommend EKG  at next appt - no record of EKG  Lipids are goal with crestor 20mg  qd   BP is at goal today  Diabetes screening - UTd  Bone Denisty / Osteoporosis Screening - UTD next due 03/2017 - send referral  Prolia 60mg  given SQ in abdomin today  Mammogram - no longer requried but reminded patient to continue to check breast monthly at home.  Glaucoma screening /  Eye Exam - needed  Nutrition counseling - continue limit fat intake. Recommended increase non starchy vegetables and fruits  Continue to eat foods with calcium - goal is 1200mg  daily.   Smoking cessation counseling - patient continues to decreased smoking - tips discussed  to help her stop completely  Advanced Directives - copy requested  Physical Activity - discussed start Silver Sneaker program.     Patient Instructions (the written plan) were given to the patient.   Henrene Pastorammy Zian Mohamed, PharmD   09/01/2016

## 2016-09-01 NOTE — Patient Instructions (Addendum)
  Eileen Kidd , Thank you for taking time to come for your Medicare Wellness Visit. I appreciate your ongoing commitment to your health goals. Please review the following plan we discussed and let me know if I can assist you in the future.   These are the goals we discussed:  Look for copy of Advanced Directives - Living Will and Healthcare Power of Attorney (important to know where these are kept - you can also bring copy to our office to be placed in our file / electronic chart)  Check to see if your insurance allows for Silver Sneaker program - great way to get exercise.  Also can restart your stretching and lifting hand weight at home.  Goal is to exercise 150 minutes each week.    Increase non-starchy vegetables - carrots, green bean, squash, zucchini, tomatoes, onions, peppers, spinach and other green leafy vegetables, cabbage, lettuce, cucumbers, asparagus, okra (not fried), eggplant Limit sugar and processed foods (cakes, cookies, ice cream, crackers and chips) Increase fresh fruit but limit serving sizes 1/2 cup or about the size of tennis or baseball Limit red meat to no more than 1-2 times per week (serving size about the size of your palm) Choose whole grains / lean proteins - whole wheat bread, quinoa, whole grain rice (1/2 cup), fish, chicken, Malaw[place]urkey Avoid sugar and calorie containing beverages - soda, sweet tea and juice.  Choose water or unsweetened tea instead.    This is a list of the screening recommended for you and due dates:  Health Maintenance  Topic Date Due  . Tetanus Vaccine  Received today  . Pneumonia vaccines (1 of 2 - PCV13) 08/07/2003 - will check your paper chart to see if  You received in past  . DEXA scan (bone density measurement)  04/14/2017  . Flu Shot  Addressed

## 2017-01-25 ENCOUNTER — Ambulatory Visit: Payer: Medicare Other | Admitting: Family Medicine

## 2017-01-26 ENCOUNTER — Other Ambulatory Visit: Payer: Self-pay | Admitting: Family Medicine

## 2017-02-14 ENCOUNTER — Ambulatory Visit (INDEPENDENT_AMBULATORY_CARE_PROVIDER_SITE_OTHER): Payer: Medicare Other | Admitting: Family Medicine

## 2017-02-14 ENCOUNTER — Encounter: Payer: Self-pay | Admitting: Family Medicine

## 2017-02-14 VITALS — BP 127/80 | HR 68 | Temp 97.8°F | Ht 61.0 in | Wt 101.5 lb

## 2017-02-14 DIAGNOSIS — E785 Hyperlipidemia, unspecified: Secondary | ICD-10-CM

## 2017-02-14 DIAGNOSIS — M810 Age-related osteoporosis without current pathological fracture: Secondary | ICD-10-CM

## 2017-02-14 LAB — CMP14+EGFR
ALBUMIN: 4.6 g/dL (ref 3.5–4.8)
ALK PHOS: 47 IU/L (ref 39–117)
ALT: 17 IU/L (ref 0–32)
AST: 22 IU/L (ref 0–40)
Albumin/Globulin Ratio: 2.2 (ref 1.2–2.2)
BUN / CREAT RATIO: 15 (ref 12–28)
BUN: 10 mg/dL (ref 8–27)
Bilirubin Total: 0.4 mg/dL (ref 0.0–1.2)
CO2: 27 mmol/L (ref 20–29)
CREATININE: 0.67 mg/dL (ref 0.57–1.00)
Calcium: 9.7 mg/dL (ref 8.7–10.3)
Chloride: 102 mmol/L (ref 96–106)
GFR calc Af Amer: 97 mL/min/{1.73_m2} (ref >59)
GFR calc non Af Amer: 84 mL/min/{1.73_m2} (ref >59)
GLUCOSE: 96 mg/dL (ref 65–99)
Globulin, Total: 2.1 g/dL (ref 1.5–4.5)
Potassium: 4.6 mmol/L (ref 3.5–5.2)
Sodium: 144 mmol/L (ref 134–144)
Total Protein: 6.7 g/dL (ref 6.0–8.5)

## 2017-02-14 LAB — LIPID PANEL
CHOL/HDL RATIO: 2.1 ratio (ref 0.0–4.4)
Cholesterol, Total: 186 mg/dL (ref 100–199)
HDL: 89 mg/dL (ref >39)
LDL Calculated: 78 mg/dL (ref 0–99)
Triglycerides: 93 mg/dL (ref 0–149)
VLDL Cholesterol Cal: 19 mg/dL (ref 5–40)

## 2017-02-14 LAB — CBC WITH DIFFERENTIAL/PLATELET
BASOS ABS: 0.1 10*3/uL (ref 0.0–0.2)
Basos: 1 %
EOS (ABSOLUTE): 0.2 10*3/uL (ref 0.0–0.4)
Eos: 2 %
HEMOGLOBIN: 14 g/dL (ref 11.1–15.9)
Hematocrit: 41 % (ref 34.0–46.6)
Immature Grans (Abs): 0 10*3/uL (ref 0.0–0.1)
Immature Granulocytes: 0 %
LYMPHS ABS: 3.1 10*3/uL (ref 0.7–3.1)
LYMPHS: 39 %
MCH: 31.3 pg (ref 26.6–33.0)
MCHC: 34.1 g/dL (ref 31.5–35.7)
MCV: 92 fL (ref 79–97)
MONOCYTES: 9 %
Monocytes Absolute: 0.7 10*3/uL (ref 0.1–0.9)
Neutrophils Absolute: 4 10*3/uL (ref 1.4–7.0)
Neutrophils: 49 %
PLATELETS: 307 10*3/uL (ref 150–379)
RBC: 4.47 x10E6/uL (ref 3.77–5.28)
RDW: 13.9 % (ref 12.3–15.4)
WBC: 8.1 10*3/uL (ref 3.4–10.8)

## 2017-02-14 MED ORDER — DENOSUMAB 60 MG/ML ~~LOC~~ SOLN: 60.0000 mg | SUBCUTANEOUS | 2 refills | Status: DC

## 2017-02-14 NOTE — Progress Notes (Signed)
BP 127/80   Pulse 68   Temp 97.8 F (36.6 C) (Oral)   Ht _0  (1.549 m)   Wt 101 lb 8 oz (46 kg)   BMI 19.18 kg/m    Subjective:    Patient ID: Eileen Kidd, female    DOB: 1941-06-22, 76 y.o.   MRN: [id number]  HPI: Eileen Kidd is a 76 y.o. female presenting on 02/14/2017 for Hyperlipidemia (6 mo)   HPI Hyperlipidemia Patient is coming in for recheck of his hyperlipidemia. The patient is currently taking Crestor. They deny any issues with myalgias or history of liver damage from it. They deny any focal numbness or weakness or chest pain.   Osteoporosis management Patient is coming in for osteoporosis management. She has been on Procardia and has been doing well and denies any major side effects from it. This was being managed by her clinical pharmacist but now we are going to be taken over and will send the medicines that she can get the prescription for it when she is due.  Relevant past medical, surgical, family and social history reviewed and updated as indicated. Interim medical history since our last visit reviewed. Allergies and medications reviewed and updated.  Review of Systems  Constitutional: Negative for chills and fever.  HENT: Negative for congestion, ear discharge and ear pain.   Eyes: Negative for redness and visual disturbance.  Respiratory: Negative for chest tightness and shortness of breath.   Cardiovascular: Negative for chest pain and leg swelling.  Musculoskeletal: Negative for back pain and gait problem.  Skin: Negative for rash.  Neurological: Negative for dizziness, light-headedness and headaches.  Psychiatric/Behavioral: Negative for agitation and behavioral problems.  All other systems reviewed and are negative.   Per HPI unless specifically indicated above        Objective:    BP 127/80   Pulse 68   Temp 97.8 F (36.6 C) (Oral)   Ht _1  (1.549 m)   Wt 101 lb 8 oz (46 kg)   BMI 19.18 kg/m   Wt Readings from Last 3  Encounters:  02/14/17 101 lb 8 oz (46 kg)  09/01/16 102 lb (46.3 kg)  07/28/16 101 lb (45.8 kg)    Physical Exam  Constitutional: She is oriented to person, place, and time. She appears well-developed and well-nourished. No distress.  Eyes: Conjunctivae are normal.  Neck: Neck supple. No thyromegaly present.  Cardiovascular: Normal rate, regular rhythm, normal heart sounds and intact distal pulses.   No murmur heard. Pulmonary/Chest: Effort normal and breath sounds normal. No respiratory distress. She has no wheezes. She has no rales.  Musculoskeletal: Normal range of motion. She exhibits no edema or tenderness.  Lymphadenopathy:    She has no cervical adenopathy.  Neurological: She is alert and oriented to person, place, and time. Coordination normal.  Skin: Skin is warm and dry. No rash noted. She is not diaphoretic.  Psychiatric: She has a normal mood and affect. Her behavior is normal.  Nursing note and vitals reviewed.     Assessment & Plan:   Problem List Items Addressed This Visit      Musculoskeletal and Integument   Osteoporosis   Relevant Medications   denosumab (PROLIA) 60 MG/ML SOLN injection   Other Relevant Orders   CBC with Differential/Platelet   CMP14+EGFR     Other   Dyslipidemia - Primary   Relevant Orders   Lipid panel   CMP14+EGFR      Continue current medicatios  Follow up plan: Return in about 1 year (around 02/14/2018), or if symptoms worsen or fail to improve, for Cholesterol recheck.  Counseling provided for all of the vaccine components Orders Placed This Encounter  Procedures  . Lipid panel  . CBC with Differential/Platelet  . Snook Leovanni Bjorkman, MD [facility] 02/14/2017, 11:58 AM

## 2017-02-15 ENCOUNTER — Telehealth: Payer: Self-pay | Admitting: Family Medicine

## 2017-02-15 NOTE — Telephone Encounter (Signed)
Patient aware [facility] Hire will be contacting her about her Prolia.

## 2017-02-20 ENCOUNTER — Telehealth: Payer: Self-pay | Admitting: Family Medicine

## 2017-02-20 NOTE — Telephone Encounter (Signed)
Pt aware that we have not received her med here for prolia.

## 2017-03-06 ENCOUNTER — Ambulatory Visit: Payer: Medicare Other

## 2017-03-07 ENCOUNTER — Ambulatory Visit (INDEPENDENT_AMBULATORY_CARE_PROVIDER_SITE_OTHER): Payer: Medicare Other | Admitting: *Deleted

## 2017-03-07 DIAGNOSIS — M810 Age-related osteoporosis without current pathological fracture: Secondary | ICD-10-CM

## 2017-03-07 MED ORDER — DENOSUMAB 60 MG/ML ~~LOC~~ SOLN
60.0000 mg | Freq: Once | SUBCUTANEOUS | Status: AC
  Administered 2017-03-07: 60 mg via SUBCUTANEOUS

## 2017-03-07 NOTE — Progress Notes (Signed)
Injection given and tolerated well.

## 2017-04-27 ENCOUNTER — Other Ambulatory Visit: Payer: Self-pay | Admitting: Family Medicine

## 2017-06-30 ENCOUNTER — Other Ambulatory Visit: Payer: Self-pay | Admitting: Family Medicine

## 2017-07-28 ENCOUNTER — Telehealth: Payer: Self-pay | Admitting: Family Medicine

## 2017-07-28 ENCOUNTER — Emergency Department (HOSPITAL_COMMUNITY)
Admission: EM | Admit: 2017-07-28 | Discharge: 2017-07-28 | Disposition: A | Discharge status: Home / Self Care | Payer: Medicare Other | Attending: Emergency Medicine | Admitting: Emergency Medicine

## 2017-07-28 ENCOUNTER — Ambulatory Visit: Payer: Medicare Other | Admitting: Family Medicine

## 2017-07-28 ENCOUNTER — Emergency Department (HOSPITAL_COMMUNITY): Payer: Medicare Other

## 2017-07-28 ENCOUNTER — Encounter (HOSPITAL_COMMUNITY): Payer: Self-pay

## 2017-07-28 ENCOUNTER — Other Ambulatory Visit: Payer: Self-pay

## 2017-07-28 DIAGNOSIS — Y999 Unspecified external cause status: Secondary | ICD-10-CM | POA: Diagnosis not present

## 2017-07-28 DIAGNOSIS — R0789 Other chest pain: Secondary | ICD-10-CM | POA: Insufficient documentation

## 2017-07-28 DIAGNOSIS — Y9389 Activity, other specified: Secondary | ICD-10-CM | POA: Diagnosis not present

## 2017-07-28 DIAGNOSIS — G44209 Tension-type headache, unspecified, not intractable: Secondary | ICD-10-CM | POA: Diagnosis not present

## 2017-07-28 DIAGNOSIS — R51 Headache: Secondary | ICD-10-CM | POA: Insufficient documentation

## 2017-07-28 DIAGNOSIS — F17210 Nicotine dependence, cigarettes, uncomplicated: Secondary | ICD-10-CM | POA: Insufficient documentation

## 2017-07-28 DIAGNOSIS — S199XXA Unspecified injury of neck, initial encounter: Secondary | ICD-10-CM | POA: Diagnosis not present

## 2017-07-28 DIAGNOSIS — J449 Chronic obstructive pulmonary disease, unspecified: Secondary | ICD-10-CM | POA: Diagnosis not present

## 2017-07-28 DIAGNOSIS — S0990XA Unspecified injury of head, initial encounter: Secondary | ICD-10-CM | POA: Diagnosis not present

## 2017-07-28 DIAGNOSIS — R42 Dizziness and giddiness: Secondary | ICD-10-CM | POA: Insufficient documentation

## 2017-07-28 DIAGNOSIS — Z79899 Other long term (current) drug therapy: Secondary | ICD-10-CM | POA: Diagnosis not present

## 2017-07-28 DIAGNOSIS — R079 Chest pain, unspecified: Secondary | ICD-10-CM | POA: Diagnosis not present

## 2017-07-28 DIAGNOSIS — M542 Cervicalgia: Secondary | ICD-10-CM | POA: Diagnosis not present

## 2017-07-28 DIAGNOSIS — Y92410 Unspecified street and highway as the place of occurrence of the external cause: Secondary | ICD-10-CM | POA: Insufficient documentation
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
## 2017-07-28 NOTE — ED Notes (Signed)
From CT 
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
## 2017-07-28 NOTE — ED Notes (Signed)
TT in to assess 

## 2017-07-28 NOTE — Discharge Instructions (Signed)
Apply ice packs on and off to your neck for 2 days then you may alternate with heat if you prefer.  Tylenol or ibuprofen if needed for pain.  Follow-up with your primary doctor for recheck next week, return to the ER for any worsening symptoms such as sudden increase in pain, visual changes, lethargy, and vomiting

## 2017-07-28 NOTE — ED Notes (Signed)
Pt stated she did not lose consciousness and is not on blood thinners

## 2017-07-28 NOTE — ED Notes (Signed)
TT in to reassess 
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
## 2017-08-03 ENCOUNTER — Ambulatory Visit: Payer: Medicare Other | Admitting: Family Medicine

## 2017-08-08 ENCOUNTER — Encounter: Payer: Self-pay | Admitting: Family Medicine

## 2017-08-09 ENCOUNTER — Ambulatory Visit: Payer: Medicare Other | Admitting: Family Medicine

## 2017-08-21 ENCOUNTER — Encounter: Payer: Self-pay | Admitting: Family Medicine

## 2017-08-21 ENCOUNTER — Ambulatory Visit (INDEPENDENT_AMBULATORY_CARE_PROVIDER_SITE_OTHER): Payer: Medicare Other | Admitting: Family Medicine

## 2017-08-21 VITALS — BP 140/86 | HR 85 | Temp 97.6°F | Ht 61.0 in | Wt 99.4 lb

## 2017-08-21 DIAGNOSIS — G8929 Other chronic pain: Secondary | ICD-10-CM

## 2017-08-21 DIAGNOSIS — M25562 Pain in left knee: Secondary | ICD-10-CM | POA: Diagnosis not present

## 2017-08-21 DIAGNOSIS — F0781 Postconcussional syndrome: Secondary | ICD-10-CM | POA: Diagnosis not present
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
## 2017-08-21 NOTE — Patient Instructions (Signed)
Post-Concussion Syndrome Post-concussion syndrome describes the symptoms that can occur after a head injury. These symptoms can last from weeks to months. What are the causes? It is not clear why some head injuries cause post-concussion syndrome. It can occur whether your head injury was mild or severe and whether you were wearing head protection or not. What are the signs or symptoms?  Memory difficulties.  Dizziness.  Headaches.  Double vision or blurry vision.  Sensitivity to light.  Hearing difficulties.  Depression.  Tiredness.  Weakness.  Difficulty with concentration.  Difficulty sleeping or staying asleep.  Vomiting.  Poor balance or instability on your feet.  Slow reaction time.  Difficulty learning and remembering things you have heard. How is this diagnosed? There is no test to determine whether you have post-concussion syndrome. Your health care provider may order an imaging scan of your brain, such as a CT scan, to check for other problems that may be causing your symptoms (such as a severe injury inside your skull). How is this treated? Usually, these problems disappear over time without medical care. Your health care provider may prescribe medicine to help ease your symptoms. It is important to follow up with a neurologist to evaluate your recovery and address any lingering symptoms or issues. Follow these instructions at home:  Take medicines only as directed by your health care provider. Do not take aspirin. Aspirin can slow blood clotting.  Sleep with your head slightly elevated to help with headaches.  Avoid any situation where there is potential for another head injury. This includes football, hockey, soccer, basketball, martial arts, downhill snow sports, and horseback riding. Your condition will get worse every time you experience a concussion. You should avoid these activities until you are evaluated by the appropriate follow-up health care  providers.  Keep all follow-up visits as directed by your health care provider. This is important. Contact a health care provider if:  You have increased problems paying attention or concentrating.  You have increased difficulty remembering or learning new information.  You need more time to complete tasks or assignments than before.  You have increased irritability or decreased ability to cope with stress.  You have more symptoms than before. Seek medical care if you have any of the following symptoms for more than two weeks after your injury:  Lasting (chronic) headaches.  Dizziness or balance problems.  Nausea.  Vision problems.  Increased sensitivity to noise or light.  Depression or mood swings.  Anxiety or irritability.  Memory problems.  Difficulty concentrating or paying attention.  Sleep problems.  Feeling tired all the time.  Get help right away if:  You have confusion or unusual drowsiness.  Others find it difficult to wake you up.  You have nausea or persistent, forceful vomiting.  You feel like you are moving when you are not (vertigo). Your eyes may move rapidly back and forth.  You have convulsions or faint.  You have severe, persistent headaches that are not relieved by medicine.  You cannot use your arms or legs normally.  One of your pupils is larger than the other.  You have clear or bloody discharge from your nose or ears.  Your problems are getting worse, not better. This information is not intended to replace advice given to you by your health care provider. Make sure you discuss any questions you have with your health care provider. Document Released: 12/03/2001 Document Revised: 01/01/2016 Document Reviewed: 09/18/2013 Elsevier Interactive Patient Education  2018 Elsevier Inc.  

## 2017-09-04 ENCOUNTER — Ambulatory Visit: Payer: Medicare Other

## 2017-09-05 ENCOUNTER — Ambulatory Visit (INDEPENDENT_AMBULATORY_CARE_PROVIDER_SITE_OTHER): Payer: Medicare Other | Admitting: *Deleted

## 2017-09-05 DIAGNOSIS — M810 Age-related osteoporosis without current pathological fracture: Secondary | ICD-10-CM

## 2017-09-05 MED ORDER — DENOSUMAB 60 MG/ML ~~LOC~~ SOLN
60.0000 mg | Freq: Once | SUBCUTANEOUS | Status: AC
  Administered 2017-09-05: 60 mg via SUBCUTANEOUS

## 2017-09-05 NOTE — Progress Notes (Signed)
Pt given Prolia inj [facility] supplied  

## 2017-10-11 ENCOUNTER — Encounter: Payer: Self-pay | Admitting: *Deleted

## 2017-10-11 ENCOUNTER — Ambulatory Visit (INDEPENDENT_AMBULATORY_CARE_PROVIDER_SITE_OTHER): Payer: Medicare Other | Admitting: *Deleted

## 2017-10-11 ENCOUNTER — Ambulatory Visit (INDEPENDENT_AMBULATORY_CARE_PROVIDER_SITE_OTHER): Payer: Medicare Other

## 2017-10-11 VITALS — BP 130/64 | HR 78 | Ht 60.0 in | Wt 101.0 lb

## 2017-10-11 DIAGNOSIS — Z0000 Encounter for general adult medical examination without abnormal findings: Secondary | ICD-10-CM

## 2017-10-11 DIAGNOSIS — Z23 Encounter for immunization: Secondary | ICD-10-CM

## 2017-10-11 DIAGNOSIS — M810 Age-related osteoporosis without current pathological fracture: Secondary | ICD-10-CM | POA: Diagnosis not present

## 2017-10-11 DIAGNOSIS — Z780 Asymptomatic menopausal state: Secondary | ICD-10-CM

## 2017-10-11 NOTE — Patient Instructions (Signed)
Please work on your goal of quitting smoking.   You received the Prevnar 13 vaccine today (pneumonia shot), you will need a second pneumonia shot in 1 year.   You had your bone density test done today.   Thank you for coming in for your Annual Wellness Visit today!!   Preventive Care 65 Years and Older, Female Preventive care refers to lifestyle choices and visits with your health care provider that can promote health and wellness. What does preventive care include?  A yearly physical exam. This is also called an annual well check.  Dental exams once or twice a year.  Routine eye exams. Ask your health care provider how often you should have your eyes checked.  Personal lifestyle choices, including: ? Daily care of your teeth and gums. ? Regular physical activity. ? Eating a healthy diet. ? Avoiding tobacco and drug use. ? Limiting alcohol use. ? Practicing safe sex. ? Taking low-dose aspirin every day. ? Taking vitamin and mineral supplements as recommended by your health care provider. What happens during an annual well check? The services and screenings done by your health care provider during your annual well check will depend on your age, overall health, lifestyle risk factors, and family history of disease. Counseling Your health care provider may ask you questions about your:  Alcohol use.  Tobacco use.  Drug use.  Emotional well-being.  Home and relationship well-being.  Sexual activity.  Eating habits.  History of falls.  Memory and ability to understand (cognition).  Work and work Statistician.  Reproductive health.  Screening You may have the following tests or measurements:  Height, weight, and BMI.  Blood pressure.  Lipid and cholesterol levels. These may be checked every 5 years, or more frequently if you are over 36 years old.  Skin check.  Lung cancer screening. You may have this screening every year starting at age 41 if you have a  30-pack-year history of smoking and currently smoke or have quit within the past 15 years.  Fecal occult blood test (FOBT) of the stool. You may have this test every year starting at age 52.  Flexible sigmoidoscopy or colonoscopy. You may have a sigmoidoscopy every 5 years or a colonoscopy every 10 years starting at age 7.  Hepatitis C blood test.  Hepatitis B blood test.  Sexually transmitted disease (STD) testing.  Diabetes screening. This is done by checking your blood sugar (glucose) after you have not eaten for a while (fasting). You may have this done every 1-3 years.  Bone density scan. This is done to screen for osteoporosis. You may have this done starting at age 45.  Mammogram. This may be done every 1-2 years. Talk to your health care provider about how often you should have regular mammograms.  Talk with your health care provider about your test results, treatment options, and if necessary, the need for more tests. Vaccines Your health care provider may recommend certain vaccines, such as:  Influenza vaccine. This is recommended every year.  Tetanus, diphtheria, and acellular pertussis (Tdap, Td) vaccine. You may need a Td booster every 10 years.  Varicella vaccine. You may need this if you have not been vaccinated.  Zoster vaccine. You may need this after age 76.  Measles, mumps, and rubella (MMR) vaccine. You may need at least one dose of MMR if you were born in 1957 or later. You may also need a second dose.  Pneumococcal 13-valent conjugate (PCV13) vaccine. One dose is recommended after  age 30.  Pneumococcal polysaccharide (PPSV23) vaccine. One dose is recommended after age 21.  Meningococcal vaccine. You may need this if you have certain conditions.  Hepatitis A vaccine. You may need this if you have certain conditions or if you travel or work in places where you may be exposed to hepatitis A.  Hepatitis B vaccine. You may need this if you have certain  conditions or if you travel or work in places where you may be exposed to hepatitis B.  Haemophilus influenzae type b (Hib) vaccine. You may need this if you have certain conditions.    Talk to your health care provider about which screenings and vaccines you need and how often you need them. This information is not intended to replace advice given to you by your health care provider. Make sure you discuss any questions you have with your health care provider. Document Released: 07/10/2015 Document Revised: 03/02/2016 Document Reviewed: 04/14/2015 Elsevier Interactive Patient Education  2018 [place] in the [place] Falls can cause injuries. They can happen to people of all ages. There are many things you can do to make your home safe and to help prevent falls. What can I do on the outside of my home?  Regularly fix the edges of walkways and driveways and fix any cracks.  Remove anything that might make you trip as you walk through a door, such as a raised step or threshold.  Trim any bushes or trees on the path to your home.  Use bright outdoor lighting.  Clear any walking paths of anything that might make someone trip, such as rocks or tools.  Regularly check to see if handrails are loose or broken. Make sure that both sides of any steps have handrails.  Any raised decks and porches should have guardrails on the edges.  Have any leaves, snow, or ice cleared regularly.  Use sand or salt on walking paths during winter.  Clean up any spills in your garage right away. This includes oil or grease spills. What can I do in the bathroom?  Use night lights.  Install grab bars by the toilet and in the tub and shower. Do not use towel bars as grab bars.  Use non-skid mats or decals in the tub or shower.  If you need to sit down in the shower, use a plastic, non-slip stool.  Keep the floor dry. Clean up any water that spills on the floor as soon as it  happens.  Remove soap buildup in the tub or shower regularly.  Attach bath mats securely with double-sided non-slip rug tape.  Do not have throw rugs and other things on the floor that can make you trip. What can I do in the bedroom?  Use night lights.  Make sure that you have a light by your bed that is easy to reach.  Do not use any sheets or blankets that are too big for your bed. They should not hang down onto the floor.  Have a firm chair that has side arms. You can use this for support while you get dressed.  Do not have throw rugs and other things on the floor that can make you trip. What can I do in the kitchen?  Clean up any spills right away.  Avoid walking on wet floors.  Keep items that you use a lot in easy-to-reach places.  If you need to reach something above you, use a strong step stool that has  a grab bar.  Keep electrical cords out of the way.  Do not use floor polish or wax that makes floors slippery. If you must use wax, use non-skid floor wax.  Do not have throw rugs and other things on the floor that can make you trip. What can I do with my stairs?  Do not leave any items on the stairs.  Make sure that there are handrails on both sides of the stairs and use them. Fix handrails that are broken or loose. Make sure that handrails are as long as the stairways.  Check any carpeting to make sure that it is firmly attached to the stairs. Fix any carpet that is loose or worn.  Avoid having throw rugs at the top or bottom of the stairs. If you do have throw rugs, attach them to the floor with carpet tape.  Make sure that you have a light switch at the top of the stairs and the bottom of the stairs. If you do not have them, ask someone to add them for you. What else can I do to help prevent falls?  Wear shoes that: ? Do not have high heels. ? Have rubber bottoms. ? Are comfortable and fit you well. ? Are closed at the toe. Do not wear sandals.  If you  use a stepladder: ? Make sure that it is fully opened. Do not climb a closed stepladder. ? Make sure that both sides of the stepladder are locked into place. ? Ask someone to hold it for you, if possible.  Clearly mark and make sure that you can see: ? Any grab bars or handrails. ? First and last steps. ? Where the edge of each step is.  Use tools that help you move around (mobility aids) if they are needed. These include: ? Canes. ? Walkers. ? Scooters. ? Crutches.  Turn on the lights when you go into a dark area. Replace any light bulbs as soon as they burn out.  Set up your furniture so you have a clear path. Avoid moving your furniture around.  If any of your floors are uneven, fix them.  If there are any pets around you, be aware of where they are.  Review your medicines with your doctor. Some medicines can make you feel dizzy. This can increase your chance of falling. Ask your doctor what other things that you can do to help prevent falls. This information is not intended to replace advice given to you by your health care provider. Make sure you discuss any questions you have with your health care provider. Document Released: 04/09/2009 Document Revised: 11/19/2015 Document Reviewed: 07/18/2014 Elsevier Interactive Patient Education  [employer].
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
## 2017-11-04 ENCOUNTER — Other Ambulatory Visit: Payer: Self-pay | Admitting: Family Medicine

## 2017-11-06 NOTE — Telephone Encounter (Signed)
Last lipid 02/14/17

## 2017-12-22 ENCOUNTER — Telehealth: Payer: Self-pay | Admitting: Family Medicine

## 2017-12-22 NOTE — Telephone Encounter (Signed)
Pt aware - will cont fish oil BID

## 2017-12-22 NOTE — Telephone Encounter (Signed)
That is really up to her, the fish oil can help some with cardiac benefit and help reduce triglycerides but if she wants to try off of it for the next time we test her blood work that is fine, I really leave this decision up to the patient is a lot of times.

## 2018-01-30 ENCOUNTER — Ambulatory Visit (INDEPENDENT_AMBULATORY_CARE_PROVIDER_SITE_OTHER): Payer: Medicare Other

## 2018-01-30 ENCOUNTER — Encounter: Payer: Self-pay | Admitting: Family Medicine

## 2018-01-30 ENCOUNTER — Ambulatory Visit (INDEPENDENT_AMBULATORY_CARE_PROVIDER_SITE_OTHER): Payer: Medicare Other | Admitting: Family Medicine

## 2018-01-30 VITALS — BP 141/81 | HR 75 | Temp 97.0°F | Ht 60.0 in | Wt 104.1 lb

## 2018-01-30 DIAGNOSIS — L03115 Cellulitis of right lower limb: Secondary | ICD-10-CM | POA: Diagnosis not present

## 2018-01-30 DIAGNOSIS — M25571 Pain in right ankle and joints of right foot: Secondary | ICD-10-CM

## 2018-01-30 DIAGNOSIS — S99911A Unspecified injury of right ankle, initial encounter: Secondary | ICD-10-CM | POA: Diagnosis not present

## 2018-01-30 DIAGNOSIS — I878 Other specified disorders of veins: Secondary | ICD-10-CM

## 2018-01-30 MED ORDER — AMOXICILLIN-POT CLAVULANATE 875-125 MG PO TABS: 1.0000 | ORAL_TABLET | Freq: Two times a day (BID) | ORAL | 0 refills | Status: DC

## 2018-01-30 NOTE — Progress Notes (Signed)
Chief Complaint  Patient presents with  . Leg Swelling    pt here today c/o leg swelling and red since Friday    HPI  Patient presents today for swelling redness and pain in the right lower leg.  This has actually improved some overnight.  The swelling has resolved.  The pain is much better.  It began on August 2 when she felt a bug bite on the right calf.  She fell when this occurred.  She subsequently developed swelling and redness in the right ankle extending up the leg.  She has been able to ambulate as long as she uses her cane.  Ambulation exacerbated her pain until today.  Now she denies pain at rest or ambulation.  PMH: Smoking status noted ROS: Review of Systems  Constitutional: Negative for fever.  Gastrointestinal: Negative for abdominal pain and rectal pain.  Neurological: Negative for syncope and numbness.    Objective: BP (!) 141/81   Pulse 75   Temp (!) 97 F (36.1 C) (Oral)   Ht 5' (1.524 m)   Wt 104 lb 2 oz (47.2 kg)   BMI 20.34 kg/m  Gen: NAD, alert, cooperative with exam HEENT: NCAT, EOMI, PERRL Ext: No edema, warm with erythema noted at the medial aspect of the right ankle extending halfway up the medial surface of the lower leg.  She has multiple varicosities noted as well. Neuro: Alert and oriented, No gross deficits X-ray right ankle: Shows no acute change including fracture.  There is degenerative joint disease. Assessment and plan:  1. Cellulitis of right lower extremity   2. Acute right ankle pain   3. Venous stasis of lower extremity     Meds ordered this encounter  Medications  . amoxicillin-clavulanate (AUGMENTIN) 875-125 MG tablet    Sig: Take 1 tablet by mouth 2 (two) times daily. Take all of this medication    Dispense:  20 tablet    Refill:  0    Orders Placed This Encounter  Procedures  . DG Ankle Complete Right    Standing Status:   Future    Number of Occurrences:   1    Standing Expiration Date:   04/01/2019    Order Specific  Question:   Reason for Exam (SYMPTOM  OR DIAGNOSIS REQUIRED)    Answer:   ankle pain    Order Specific Question:   Preferred imaging location?    Answer:   Internal    Follow up 2 weeks  Mechele ClaudeWarren Latiya Navia, MD

## 2018-02-01 ENCOUNTER — Other Ambulatory Visit: Payer: Self-pay | Admitting: Family Medicine

## 2018-02-01 NOTE — Telephone Encounter (Signed)
Last lipid 8/28 /18 

## 2018-02-15 ENCOUNTER — Ambulatory Visit (INDEPENDENT_AMBULATORY_CARE_PROVIDER_SITE_OTHER): Payer: Medicare Other | Admitting: Family Medicine

## 2018-02-15 ENCOUNTER — Encounter: Payer: Self-pay | Admitting: Family Medicine

## 2018-02-15 VITALS — BP 118/71 | HR 79 | Temp 97.2°F | Ht 60.0 in | Wt 103.0 lb

## 2018-02-15 DIAGNOSIS — Z131 Encounter for screening for diabetes mellitus: Secondary | ICD-10-CM | POA: Diagnosis not present

## 2018-02-15 DIAGNOSIS — M810 Age-related osteoporosis without current pathological fracture: Secondary | ICD-10-CM | POA: Diagnosis not present

## 2018-02-15 DIAGNOSIS — E785 Hyperlipidemia, unspecified: Secondary | ICD-10-CM

## 2018-02-15 LAB — CMP14+EGFR
ALBUMIN: 4.3 g/dL (ref 3.5–4.8)
ALK PHOS: 47 IU/L (ref 39–117)
ALT: 17 IU/L (ref 0–32)
AST: 24 IU/L (ref 0–40)
Albumin/Globulin Ratio: 2.2 (ref 1.2–2.2)
BUN / CREAT RATIO: 16 (ref 12–28)
BUN: 10 mg/dL (ref 8–27)
Bilirubin Total: 0.5 mg/dL (ref 0.0–1.2)
CALCIUM: 9.8 mg/dL (ref 8.7–10.3)
CO2: 24 mmol/L (ref 20–29)
CREATININE: 0.61 mg/dL (ref 0.57–1.00)
Chloride: 103 mmol/L (ref 96–106)
GFR calc Af Amer: 100 mL/min/{1.73_m2} (ref >59)
GFR, EST NON AFRICAN AMERICAN: 87 mL/min/{1.73_m2} (ref >59)
GLOBULIN, TOTAL: 2 g/dL (ref 1.5–4.5)
Glucose: 103 mg/dL — ABNORMAL HIGH (ref 65–99)
Potassium: 4.8 mmol/L (ref 3.5–5.2)
SODIUM: 143 mmol/L (ref 134–144)
TOTAL PROTEIN: 6.3 g/dL (ref 6.0–8.5)

## 2018-02-15 LAB — CBC WITH DIFFERENTIAL/PLATELET
Basophils Absolute: 0.1 10*3/uL (ref 0.0–0.2)
Basos: 1 %
EOS (ABSOLUTE): 0.1 10*3/uL (ref 0.0–0.4)
Eos: 2 %
HEMATOCRIT: 42.4 % (ref 34.0–46.6)
Hemoglobin: 14 g/dL (ref 11.1–15.9)
Immature Grans (Abs): 0 10*3/uL (ref 0.0–0.1)
Immature Granulocytes: 0 %
LYMPHS ABS: 2 10*3/uL (ref 0.7–3.1)
Lymphs: 30 %
MCH: 31 pg (ref 26.6–33.0)
MCHC: 33 g/dL (ref 31.5–35.7)
MCV: 94 fL (ref 79–97)
Monocytes Absolute: 0.5 10*3/uL (ref 0.1–0.9)
Monocytes: 8 %
Neutrophils Absolute: 3.9 10*3/uL (ref 1.4–7.0)
Neutrophils: 59 %
Platelets: 321 10*3/uL (ref 150–450)
RBC: 4.51 x10E6/uL (ref 3.77–5.28)
RDW: 14 % (ref 12.3–15.4)
WBC: 6.6 10*3/uL (ref 3.4–10.8)

## 2018-02-15 LAB — LIPID PANEL
Chol/HDL Ratio: 2.2 ratio (ref 0.0–4.4)
Cholesterol, Total: 172 mg/dL (ref 100–199)
HDL: 80 mg/dL (ref >39)
LDL CALC: 81 mg/dL (ref 0–99)
Triglycerides: 53 mg/dL (ref 0–149)
VLDL CHOLESTEROL CAL: 11 mg/dL (ref 5–40)

## 2018-02-15 NOTE — Progress Notes (Signed)
BP 118/71 (BP Location: Left Arm)   Pulse 79   Temp (!) 97.2 F (36.2 C) (Oral)   Ht 5' (1.524 m)   Wt 103 lb (46.7 kg)   BMI 20.12 kg/m    Subjective:    Patient ID: Eileen Kidd, female    DOB: May 28, 1941, 77 y.o.   MRN: [id number]  HPI: Eileen Kidd is a 77 y.o. female presenting on 02/15/2018 for Hyperlipidemia (6 mo)   HPI Hyperlipidemia Patient is coming in for recheck of his hyperlipidemia. The patient is currently taking Crestor. They deny any issues with myalgias or history of liver damage from it. They deny any focal numbness or weakness or chest pain.   Osteoporosis Patient is taking vitamin D and she is also been doing Prolia, she is due for her next injection after September 12 and she will schedule that on the way out.  She denies any fractures falls and has been doing relatively well.  She is not due for bone scan until 2021  Relevant past medical, surgical, family and social history reviewed and updated as indicated. Interim medical history since our last visit reviewed. Allergies and medications reviewed and updated.  Review of Systems  Constitutional: Negative for chills and fever.  Eyes: Negative for visual disturbance.  Respiratory: Negative for chest tightness and shortness of breath.   Cardiovascular: Negative for chest pain and leg swelling.  Musculoskeletal: Negative for arthralgias, back pain and gait problem.  Skin: Negative for color change and rash.  Neurological: Negative for dizziness, weakness, light-headedness, numbness and headaches.  Psychiatric/Behavioral: Negative for agitation and behavioral problems.  All other systems reviewed and are negative.   Per HPI unless specifically indicated above   Allergies as of 02/15/2018      Reactions   Alendronate    espophagitis / GI pain / unsteady gait      Medication List        Accurate as of 02/15/18  8:24 AM. Always use your most recent med list.          denosumab 60 MG/ML Soln  injection Commonly known as:  PROLIA Inject 60 mg into the skin every 6 (six) months. Administer in upper arm, thigh, or abdomen   MULTIVITAMIN & MINERAL PO Take 1 tablet by mouth daily.   rosuvastatin 20 MG tablet Commonly known as:  CRESTOR TAKE 1 TABLET BY MOUTH EVERY DAY IN THE EVENING          Objective:    BP 118/71 (BP Location: Left Arm)   Pulse 79   Temp (!) 97.2 F (36.2 C) (Oral)   Ht 5' (1.524 m)   Wt 103 lb (46.7 kg)   BMI 20.12 kg/m   Wt Readings from Last 3 Encounters:  02/15/18 103 lb (46.7 kg)  01/30/18 104 lb 2 oz (47.2 kg)  10/11/17 101 lb (45.8 kg)    Physical Exam  Constitutional: She is oriented to person, place, and time. She appears well-developed and well-nourished. No distress.  HENT:  Nose: Nose normal.  Mouth/Throat: Oropharynx is clear and moist. No oropharyngeal exudate.  Eyes: Pupils are equal, round, and reactive to light. Conjunctivae are normal.  Neck: Neck supple. No thyromegaly present.  Cardiovascular: Normal rate, regular rhythm, normal heart sounds and intact distal pulses.  No murmur heard. Pulmonary/Chest: Effort normal and breath sounds normal. No respiratory distress. She has no wheezes.  Musculoskeletal: Normal range of motion. She exhibits no edema or tenderness.  Lymphadenopathy:  She has no cervical adenopathy.  Neurological: She is alert and oriented to person, place, and time. Coordination normal.  Skin: Skin is warm and dry. No rash noted. She is not diaphoretic.  Psychiatric: She has a normal mood and affect. Her behavior is normal.  Nursing note and vitals reviewed.       Assessment & Plan:   Problem List Items Addressed This Visit      Musculoskeletal and Integument   Osteoporosis - Primary   Relevant Orders   CBC with Differential/Platelet     Other   Dyslipidemia   Relevant Orders   CBC with Differential/Platelet   Lipid panel    Other Visit Diagnoses    Diabetes mellitus screening        Relevant Orders   CBC with Differential/Platelet   CMP14+EGFR       Follow up plan: Return in about 1 year (around 02/16/2019), or if symptoms worsen or fail to improve, for Hyperlipidemia follow-up.  Counseling provided for all of the vaccine components Orders Placed This Encounter  Procedures  . CBC with Differential/Platelet  . CMP14+EGFR  . Lipid panel    Caryl Pina, MD [facility] 02/15/2018, 8:24 AM

## 2018-03-09 ENCOUNTER — Ambulatory Visit (INDEPENDENT_AMBULATORY_CARE_PROVIDER_SITE_OTHER): Payer: Medicare Other | Admitting: *Deleted

## 2018-03-09 DIAGNOSIS — M810 Age-related osteoporosis without current pathological fracture: Secondary | ICD-10-CM

## 2018-03-09 MED ORDER — DENOSUMAB 60 MG/ML ~~LOC~~ SOSY
60.0000 mg | PREFILLED_SYRINGE | Freq: Once | SUBCUTANEOUS | Status: AC
  Administered 2018-03-09: 60 mg via SUBCUTANEOUS

## 2018-03-09 NOTE — Progress Notes (Signed)
Pt tolerated well - no reaction/ no pain

## 2018-03-12 ENCOUNTER — Telehealth: Payer: Self-pay | Admitting: Family Medicine

## 2018-03-12 NOTE — Telephone Encounter (Signed)
If it is just swollen but not red then she is good to use compression stockings, if she still feels like it is red and warm then have her come in and see us again

## 2018-03-12 NOTE — Telephone Encounter (Signed)
No open areas, but right leg is still swollen

## 2018-03-12 NOTE — Telephone Encounter (Signed)
Pt will call us if needed

## 2018-04-06 ENCOUNTER — Ambulatory Visit (INDEPENDENT_AMBULATORY_CARE_PROVIDER_SITE_OTHER): Payer: Medicare Other

## 2018-04-06 DIAGNOSIS — Z23 Encounter for immunization: Secondary | ICD-10-CM | POA: Diagnosis not present

## 2018-05-14 ENCOUNTER — Other Ambulatory Visit: Payer: Self-pay | Admitting: Family Medicine

## 2018-08-10 ENCOUNTER — Other Ambulatory Visit: Payer: Self-pay | Admitting: Family Medicine

## 2018-08-10 NOTE — Telephone Encounter (Signed)
OV 02/15/18 rtc 1 yr 

## 2018-08-18 ENCOUNTER — Inpatient Hospital Stay (HOSPITAL_COMMUNITY): Payer: Medicare Other | Admitting: Certified Registered Nurse Anesthetist

## 2018-08-18 ENCOUNTER — Emergency Department (HOSPITAL_COMMUNITY): Payer: Medicare Other

## 2018-08-18 ENCOUNTER — Inpatient Hospital Stay (HOSPITAL_COMMUNITY): Payer: Medicare Other

## 2018-08-18 ENCOUNTER — Encounter (HOSPITAL_COMMUNITY)
Admission: EM | Disposition: A | Discharge status: Skilled Nursing Facility | Payer: Self-pay | Source: Home / Self Care | Attending: Internal Medicine

## 2018-08-18 ENCOUNTER — Inpatient Hospital Stay (HOSPITAL_COMMUNITY)
Admission: EM | Admit: 2018-08-18 | Discharge: 2018-08-22 | DRG: 481 | Disposition: A | Discharge status: Skilled Nursing Facility | Payer: Medicare Other | Attending: Internal Medicine | Admitting: Internal Medicine

## 2018-08-18 ENCOUNTER — Encounter (HOSPITAL_COMMUNITY): Payer: Self-pay | Admitting: Emergency Medicine

## 2018-08-18 ENCOUNTER — Inpatient Hospital Stay: Admit: 2018-08-18 | Payer: Medicare Other | Admitting: Orthopedic Surgery

## 2018-08-18 DIAGNOSIS — M1990 Unspecified osteoarthritis, unspecified site: Secondary | ICD-10-CM | POA: Diagnosis present

## 2018-08-18 DIAGNOSIS — W19XXXA Unspecified fall, initial encounter: Secondary | ICD-10-CM | POA: Diagnosis not present

## 2018-08-18 DIAGNOSIS — M80851A Other osteoporosis with current pathological fracture, right femur, initial encounter for fracture: Principal | ICD-10-CM | POA: Diagnosis present

## 2018-08-18 DIAGNOSIS — S72001A Fracture of unspecified part of neck of right femur, initial encounter for closed fracture: Secondary | ICD-10-CM | POA: Diagnosis not present

## 2018-08-18 DIAGNOSIS — L8990 Pressure ulcer of unspecified site, unspecified stage: Secondary | ICD-10-CM

## 2018-08-18 DIAGNOSIS — M62838 Other muscle spasm: Secondary | ICD-10-CM | POA: Diagnosis not present

## 2018-08-18 DIAGNOSIS — Z8249 Family history of ischemic heart disease and other diseases of the circulatory system: Secondary | ICD-10-CM | POA: Diagnosis not present

## 2018-08-18 DIAGNOSIS — Z888 Allergy status to other drugs, medicaments and biological substances status: Secondary | ICD-10-CM

## 2018-08-18 DIAGNOSIS — W1830XA Fall on same level, unspecified, initial encounter: Secondary | ICD-10-CM | POA: Diagnosis present

## 2018-08-18 DIAGNOSIS — L89152 Pressure ulcer of sacral region, stage 2: Secondary | ICD-10-CM | POA: Diagnosis present

## 2018-08-18 DIAGNOSIS — D62 Acute posthemorrhagic anemia: Secondary | ICD-10-CM | POA: Diagnosis not present

## 2018-08-18 DIAGNOSIS — Z823 Family history of stroke: Secondary | ICD-10-CM | POA: Diagnosis not present

## 2018-08-18 DIAGNOSIS — S72141A Displaced intertrochanteric fracture of right femur, initial encounter for closed fracture: Secondary | ICD-10-CM | POA: Diagnosis not present

## 2018-08-18 DIAGNOSIS — Z4789 Encounter for other orthopedic aftercare: Secondary | ICD-10-CM | POA: Diagnosis not present

## 2018-08-18 DIAGNOSIS — R278 Other lack of coordination: Secondary | ICD-10-CM | POA: Diagnosis not present

## 2018-08-18 DIAGNOSIS — J9811 Atelectasis: Secondary | ICD-10-CM | POA: Diagnosis not present

## 2018-08-18 DIAGNOSIS — M25551 Pain in right hip: Secondary | ICD-10-CM

## 2018-08-18 DIAGNOSIS — E785 Hyperlipidemia, unspecified: Secondary | ICD-10-CM

## 2018-08-18 DIAGNOSIS — R0902 Hypoxemia: Secondary | ICD-10-CM | POA: Diagnosis not present

## 2018-08-18 DIAGNOSIS — M6281 Muscle weakness (generalized): Secondary | ICD-10-CM | POA: Diagnosis not present

## 2018-08-18 DIAGNOSIS — Z803 Family history of malignant neoplasm of breast: Secondary | ICD-10-CM

## 2018-08-18 DIAGNOSIS — S299XXA Unspecified injury of thorax, initial encounter: Secondary | ICD-10-CM | POA: Diagnosis not present

## 2018-08-18 DIAGNOSIS — D72829 Elevated white blood cell count, unspecified: Secondary | ICD-10-CM | POA: Diagnosis not present

## 2018-08-18 DIAGNOSIS — Y92009 Unspecified place in unspecified non-institutional (private) residence as the place of occurrence of the external cause: Secondary | ICD-10-CM | POA: Diagnosis not present

## 2018-08-18 DIAGNOSIS — J449 Chronic obstructive pulmonary disease, unspecified: Secondary | ICD-10-CM | POA: Diagnosis not present

## 2018-08-18 DIAGNOSIS — F17210 Nicotine dependence, cigarettes, uncomplicated: Secondary | ICD-10-CM | POA: Diagnosis present

## 2018-08-18 DIAGNOSIS — R410 Disorientation, unspecified: Secondary | ICD-10-CM | POA: Diagnosis not present

## 2018-08-18 DIAGNOSIS — S72142A Displaced intertrochanteric fracture of left femur, initial encounter for closed fracture: Secondary | ICD-10-CM | POA: Diagnosis not present

## 2018-08-18 DIAGNOSIS — M810 Age-related osteoporosis without current pathological fracture: Secondary | ICD-10-CM | POA: Diagnosis not present

## 2018-08-18 DIAGNOSIS — K5900 Constipation, unspecified: Secondary | ICD-10-CM | POA: Diagnosis not present

## 2018-08-18 DIAGNOSIS — Z8261 Family history of arthritis: Secondary | ICD-10-CM

## 2018-08-18 DIAGNOSIS — Z5189 Encounter for other specified aftercare: Secondary | ICD-10-CM | POA: Diagnosis not present

## 2018-08-18 DIAGNOSIS — R52 Pain, unspecified: Secondary | ICD-10-CM | POA: Diagnosis not present

## 2018-08-18 DIAGNOSIS — S72141D Displaced intertrochanteric fracture of right femur, subsequent encounter for closed fracture with routine healing: Secondary | ICD-10-CM | POA: Diagnosis not present

## 2018-08-18 DIAGNOSIS — R2681 Unsteadiness on feet: Secondary | ICD-10-CM | POA: Diagnosis not present

## 2018-08-18 DIAGNOSIS — Z419 Encounter for procedure for purposes other than remedying health state, unspecified: Secondary | ICD-10-CM

## 2018-08-18 DIAGNOSIS — Z833 Family history of diabetes mellitus: Secondary | ICD-10-CM | POA: Diagnosis not present

## 2018-08-18 DIAGNOSIS — S72101A Unspecified trochanteric fracture of right femur, initial encounter for closed fracture: Secondary | ICD-10-CM | POA: Diagnosis not present

## 2018-08-18 DIAGNOSIS — J439 Emphysema, unspecified: Secondary | ICD-10-CM | POA: Diagnosis not present

## 2018-08-18 DIAGNOSIS — Z7901 Long term (current) use of anticoagulants: Secondary | ICD-10-CM | POA: Diagnosis not present

## 2018-08-18 HISTORY — PX: FEMUR IM NAIL: SHX1597

## 2018-08-18 LAB — ABO/RH: ABO/RH(D): O POS

## 2018-08-18 LAB — CBC WITH DIFFERENTIAL/PLATELET
Abs Immature Granulocytes: 0.1 10*3/uL — ABNORMAL HIGH (ref 0.00–0.07)
Basophils Absolute: 0.1 10*3/uL (ref 0.0–0.1)
Basophils Relative: 0 %
Eosinophils Absolute: 0 10*3/uL (ref 0.0–0.5)
Eosinophils Relative: 0 %
HCT: 39.3 % (ref 36.0–46.0)
Hemoglobin: 12.7 g/dL (ref 12.0–15.0)
IMMATURE GRANULOCYTES: 1 %
Lymphocytes Relative: 5 %
Lymphs Abs: 0.9 10*3/uL (ref 0.7–4.0)
MCH: 30.7 pg (ref 26.0–34.0)
MCHC: 32.3 g/dL (ref 30.0–36.0)
MCV: 94.9 fL (ref 80.0–100.0)
Monocytes Absolute: 0.9 10*3/uL (ref 0.1–1.0)
Monocytes Relative: 5 %
Neutro Abs: 17.2 10*3/uL — ABNORMAL HIGH (ref 1.7–7.7)
Neutrophils Relative %: 89 %
PLATELETS: 309 10*3/uL (ref 150–400)
RBC: 4.14 MIL/uL (ref 3.87–5.11)
RDW: 13.7 % (ref 11.5–15.5)
WBC: 19.2 10*3/uL — ABNORMAL HIGH (ref 4.0–10.5)
nRBC: 0 % (ref 0.0–0.2)

## 2018-08-18 LAB — BASIC METABOLIC PANEL
Anion gap: 11 (ref 5–15)
BUN: 12 mg/dL (ref 8–23)
CO2: 25 mmol/L (ref 22–32)
Calcium: 8.8 mg/dL — ABNORMAL LOW (ref 8.9–10.3)
Chloride: 106 mmol/L (ref 98–111)
Creatinine, Ser: 0.52 mg/dL (ref 0.44–1.00)
GFR calc Af Amer: >60 mL/min (ref >60)
GFR calc non Af Amer: >60 mL/min (ref >60)
GLUCOSE: 123 mg/dL — AB (ref 70–99)
Potassium: 3.7 mmol/L (ref 3.5–5.1)
Sodium: 142 mmol/L (ref 135–145)

## 2018-08-18 LAB — CK: Total CK: 86 U/L (ref 38–234)

## 2018-08-18 LAB — PROTIME-INR
INR: 0.92
Prothrombin Time: 12.3 seconds (ref 11.4–15.2)

## 2018-08-18 LAB — TROPONIN I: Troponin I: <0.03 ng/mL (ref <0.03)

## 2018-08-18 SURGERY — INSERTION, INTRAMEDULLARY ROD, FEMUR, RETROGRADE
Anesthesia: General | Laterality: Right

## 2018-08-18 MED ORDER — ROCURONIUM BROMIDE 10 MG/ML (PF) SYRINGE
PREFILLED_SYRINGE | INTRAVENOUS | Status: DC | PRN
  Administered 2018-08-18: 50 mg via INTRAVENOUS

## 2018-08-18 MED ORDER — ONDANSETRON HCL 4 MG/2ML IJ SOLN
INTRAMUSCULAR | Status: AC
  Filled 2018-08-18: qty 2

## 2018-08-18 MED ORDER — SODIUM CHLORIDE 0.9 % IV SOLN
INTRAVENOUS | Status: DC | PRN
  Administered 2018-08-18: 40 ug/min via INTRAVENOUS

## 2018-08-18 MED ORDER — METHOCARBAMOL 500 MG PO TABS
500.0000 mg | ORAL_TABLET | Freq: Four times a day (QID) | ORAL | Status: DC | PRN
  Administered 2018-08-19 – 2018-08-21 (×3): 500 mg via ORAL
  Filled 2018-08-18 (×6): qty 1

## 2018-08-18 MED ORDER — STERILE WATER FOR IRRIGATION IR SOLN
Status: DC | PRN
  Administered 2018-08-18: 2000 mL

## 2018-08-18 MED ORDER — LIDOCAINE 2% (20 MG/ML) 5 ML SYRINGE
INTRAMUSCULAR | Status: AC
  Filled 2018-08-18: qty 5

## 2018-08-18 MED ORDER — SODIUM CHLORIDE 0.9 % IV SOLN
INTRAVENOUS | Status: DC
  Administered 2018-08-18: 21:00:00 via INTRAVENOUS
  Administered 2018-08-18: 1000 mL via INTRAVENOUS

## 2018-08-18 MED ORDER — PROPOFOL 10 MG/ML IV BOLUS
INTRAVENOUS | Status: AC
  Filled 2018-08-18: qty 20

## 2018-08-18 MED ORDER — ONDANSETRON HCL 4 MG/2ML IJ SOLN
4.0000 mg | INTRAMUSCULAR | Status: DC | PRN
  Administered 2018-08-18: 4 mg via INTRAVENOUS
  Filled 2018-08-18: qty 2

## 2018-08-18 MED ORDER — 0.9 % SODIUM CHLORIDE (POUR BTL) OPTIME
TOPICAL | Status: DC | PRN
  Administered 2018-08-18: 1000 mL

## 2018-08-18 MED ORDER — PHENYLEPHRINE 40 MCG/ML (10ML) SYRINGE FOR IV PUSH (FOR BLOOD PRESSURE SUPPORT)
PREFILLED_SYRINGE | INTRAVENOUS | Status: AC
  Filled 2018-08-18: qty 10

## 2018-08-18 MED ORDER — MORPHINE SULFATE (PF) 2 MG/ML IV SOLN
INTRAVENOUS | Status: AC
  Filled 2018-08-18: qty 1

## 2018-08-18 MED ORDER — FENTANYL CITRATE (PF) 100 MCG/2ML IJ SOLN
INTRAMUSCULAR | Status: AC
  Filled 2018-08-18: qty 2

## 2018-08-18 MED ORDER — SUGAMMADEX SODIUM 200 MG/2ML IV SOLN
INTRAVENOUS | Status: DC | PRN
  Administered 2018-08-18: 100 mg via INTRAVENOUS

## 2018-08-18 MED ORDER — ISOPROPYL ALCOHOL 70 % SOLN
Status: DC | PRN
  Administered 2018-08-18: 1 via TOPICAL

## 2018-08-18 MED ORDER — ONDANSETRON HCL 4 MG/2ML IJ SOLN
INTRAMUSCULAR | Status: DC | PRN
  Administered 2018-08-18: 4 mg via INTRAVENOUS

## 2018-08-18 MED ORDER — MORPHINE SULFATE (PF) 4 MG/ML IV SOLN
4.0000 mg | INTRAVENOUS | Status: AC | PRN
  Administered 2018-08-18 (×2): 4 mg via INTRAVENOUS
  Filled 2018-08-18 (×3): qty 1

## 2018-08-18 MED ORDER — ACETAMINOPHEN 10 MG/ML IV SOLN
INTRAVENOUS | Status: DC | PRN
  Administered 2018-08-18: 750 mg via INTRAVENOUS

## 2018-08-18 MED ORDER — MORPHINE SULFATE (PF) 2 MG/ML IV SOLN
2.0000 mg | Freq: Once | INTRAVENOUS | Status: AC
  Administered 2018-08-18: 2 mg via INTRAVENOUS

## 2018-08-18 MED ORDER — CEFAZOLIN SODIUM-DEXTROSE 2-3 GM-%(50ML) IV SOLR
INTRAVENOUS | Status: DC | PRN
  Administered 2018-08-18: 2 g via INTRAVENOUS

## 2018-08-18 MED ORDER — PROPOFOL 10 MG/ML IV BOLUS
INTRAVENOUS | Status: DC | PRN
  Administered 2018-08-18: 100 mg via INTRAVENOUS

## 2018-08-18 MED ORDER — LIDOCAINE 2% (20 MG/ML) 5 ML SYRINGE
INTRAMUSCULAR | Status: DC | PRN
  Administered 2018-08-18: 60 mg via INTRAVENOUS

## 2018-08-18 MED ORDER — ACETAMINOPHEN 10 MG/ML IV SOLN
INTRAVENOUS | Status: AC
  Filled 2018-08-18: qty 100

## 2018-08-18 MED ORDER — DEXAMETHASONE SODIUM PHOSPHATE 10 MG/ML IJ SOLN
INTRAMUSCULAR | Status: AC
  Filled 2018-08-18: qty 1

## 2018-08-18 MED ORDER — CEFAZOLIN SODIUM-DEXTROSE 2-4 GM/100ML-% IV SOLN
INTRAVENOUS | Status: AC
  Filled 2018-08-18: qty 100

## 2018-08-18 MED ORDER — DEXAMETHASONE SODIUM PHOSPHATE 4 MG/ML IJ SOLN
INTRAMUSCULAR | Status: DC | PRN
  Administered 2018-08-18: 5 mg via INTRAVENOUS

## 2018-08-18 MED ORDER — METHOCARBAMOL 500 MG IVPB - SIMPLE MED
500.0000 mg | Freq: Four times a day (QID) | INTRAVENOUS | Status: DC | PRN
  Filled 2018-08-18: qty 50

## 2018-08-18 MED ORDER — FENTANYL CITRATE (PF) 100 MCG/2ML IJ SOLN
25.0000 ug | INTRAMUSCULAR | Status: DC | PRN
  Administered 2018-08-18 (×3): 25 ug via INTRAVENOUS

## 2018-08-18 MED ORDER — ISOPROPYL ALCOHOL 70 % SOLN
Status: AC
  Filled 2018-08-18: qty 480

## 2018-08-18 MED ORDER — PHENYLEPHRINE 40 MCG/ML (10ML) SYRINGE FOR IV PUSH (FOR BLOOD PRESSURE SUPPORT)
PREFILLED_SYRINGE | INTRAVENOUS | Status: DC | PRN
  Administered 2018-08-18 (×5): 80 ug via INTRAVENOUS

## 2018-08-18 MED ORDER — ROCURONIUM BROMIDE 100 MG/10ML IV SOLN
INTRAVENOUS | Status: AC
  Filled 2018-08-18: qty 1

## 2018-08-18 MED ORDER — LACTATED RINGERS IV SOLN
INTRAVENOUS | Status: DC | PRN
  Administered 2018-08-18: 17:00:00 via INTRAVENOUS

## 2018-08-18 SURGICAL SUPPLY — 55 items
ADH SKN CLS APL DERMABOND .7 (GAUZE/BANDAGES/DRESSINGS) ×1
BANDAGE ACE 6X5 VEL STRL LF (GAUZE/BANDAGES/DRESSINGS) ×2 IMPLANT
BIT DRILL AO GAMMA 4.2X130 (BIT) ×2 IMPLANT
BIT DRILL AO GAMMA 4.2X180 (BIT) ×2 IMPLANT
CHLORAPREP W/TINT 26ML (MISCELLANEOUS) ×4 IMPLANT
COVER SURGICAL LIGHT HANDLE (MISCELLANEOUS) ×3 IMPLANT
COVER WAND RF STERILE (DRAPES) IMPLANT
CUFF TOURN SGL QUICK 34 (TOURNIQUET CUFF)
CUFF TRNQT CYL 34X4.125X (TOURNIQUET CUFF) ×1 IMPLANT
DERMABOND ADVANCED (GAUZE/BANDAGES/DRESSINGS) ×2
DERMABOND ADVANCED .7 DNX12 (GAUZE/BANDAGES/DRESSINGS) ×2 IMPLANT
DRAPE C-ARM 42X120 X-RAY (DRAPES) ×3 IMPLANT
DRAPE C-ARMOR (DRAPES) ×3 IMPLANT
DRAPE ORTHO SPLIT 77X108 STRL (DRAPES) ×6
DRAPE POUCH INSTRU U-SHP 10X18 (DRAPES) ×3 IMPLANT
DRAPE SHEET LG 3/4 BI-LAMINATE (DRAPES) ×9 IMPLANT
DRAPE SURG ORHT 6 SPLT 77X108 (DRAPES) ×2 IMPLANT
DRAPE U-SHAPE 47X51 STRL (DRAPES) ×6 IMPLANT
DRSG AQUACEL AG ADV 3.5X 4 (GAUZE/BANDAGES/DRESSINGS) ×4 IMPLANT
DRSG AQUACEL AG ADV 3.5X10 (GAUZE/BANDAGES/DRESSINGS) ×6 IMPLANT
DRSG AQUACEL AG ADV 3.5X14 (GAUZE/BANDAGES/DRESSINGS) ×1 IMPLANT
ELECT REM PT RETURN 15FT ADLT (MISCELLANEOUS) ×3 IMPLANT
GAUZE SPONGE 4X4 12PLY STRL (GAUZE/BANDAGES/DRESSINGS) ×1 IMPLANT
GLOVE BIO SURGEON STRL SZ8.5 (GLOVE) ×6 IMPLANT
GLOVE BIOGEL PI IND STRL 8.5 (GLOVE) ×1 IMPLANT
GLOVE BIOGEL PI INDICATOR 8.5 (GLOVE) ×2
GOWN SPEC L3 XXLG W/TWL (GOWN DISPOSABLE) ×3 IMPLANT
GUIDEROD T2 3X1000 (ROD) ×2 IMPLANT
HANDPIECE INTERPULSE COAX TIP (DISPOSABLE)
K-WIRE  3.2X450M STR (WIRE) ×2
K-WIRE 3.2X450M STR (WIRE) ×1
KWIRE 3.2X450M STR (WIRE) IMPLANT
NAIL LONG HIP 011X360MMX125D (Nail) ×2 IMPLANT
NS IRRIG 1000ML POUR BTL (IV SOLUTION) ×3 IMPLANT
PACK TOTAL KNEE CUSTOM (KITS) ×3 IMPLANT
PADDING CAST COTTON 6X4 STRL (CAST SUPPLIES) ×1 IMPLANT
REAMER SHAFT BIXCUT (INSTRUMENTS) ×2 IMPLANT
SCREW LAG GAMMA 3 TI 10.5X90MM (Screw) ×2 IMPLANT
SCREW LOCKING T2 F/T  5X37.5MM (Screw) ×2 IMPLANT
SCREW LOCKING T2 F/T 5X37.5MM (Screw) IMPLANT
SET HNDPC FAN SPRY TIP SCT (DISPOSABLE) ×1 IMPLANT
SET PAD KNEE POSITIONER (MISCELLANEOUS) ×3 IMPLANT
STAPLER VISISTAT 35W (STAPLE) ×3 IMPLANT
SUT MON AB 2-0 CT1 36 (SUTURE) ×3 IMPLANT
SUT STRATAFIX PDO 1 14 VIOLET (SUTURE) ×3
SUT STRATFX PDO 1 14 VIOLET (SUTURE) ×1
SUT VIC AB 1 CTX 36 (SUTURE) ×3
SUT VIC AB 1 CTX36XBRD ANBCTR (SUTURE) ×1 IMPLANT
SUT VIC AB 2-0 CT1 27 (SUTURE) ×3
SUT VIC AB 2-0 CT1 TAPERPNT 27 (SUTURE) ×1 IMPLANT
SUTURE STRATFX PDO 1 14 VIOLET (SUTURE) ×1 IMPLANT
TOWEL OR 17X26 10 PK STRL BLUE (TOWEL DISPOSABLE) ×4 IMPLANT
TRAY FOLEY MTR SLVR 14FR STAT (SET/KITS/TRAYS/PACK) ×3 IMPLANT
TRAY FOLEY MTR SLVR 16FR STAT (SET/KITS/TRAYS/PACK) ×1 IMPLANT
WATER STERILE IRR 1000ML POUR (IV SOLUTION) ×6 IMPLANT

## 2018-08-18 NOTE — Op Note (Signed)
OPERATIVE REPORT  SURGEON: Samson Frederic, MD   ASSISTANT: Staff.  PREOPERATIVE DIAGNOSIS: Right pertrochanteric femur fracture.   POSTOPERATIVE DIAGNOSIS: Right pertrochanteric femur fracture.   PROCEDURE: Intramedullary fixation, Right femur.   IMPLANTS: Stryker Gamma Nail, 11 by 360 mm, 125 degrees. 10.5 x 90 mm Lag Screw. 5 x 37.5 mm distal interlocking screw 1.  ANESTHESIA:  General  ESTIMATED BLOOD LOSS:-50 mL    ANTIBIOTICS: 2 g Ancef.  DRAINS: None.  COMPLICATIONS: None.   CONDITION: PACU - hemodynamically stable.Marland Kitchen   BRIEF CLINICAL NOTE: Eileen Kidd is a 78 y.o. female who presented with an intertrochanteric femur fracture. The patient was admitted to the hospitalist service and underwent perioperative risk stratification and medical optimization. The risks, benefits, and alternatives to the procedure were explained, and the patient elected to proceed.  PROCEDURE IN DETAIL: Surgical site was marked by myself. The patient was taken to the operating room and anesthesia was induced on the bed. The patient was then transferred to the [facility] table and the nonoperative lower extremity was scissored underneath the operative side. The fracture was reduced with traction, internal rotation, and adduction. The hip was prepped and draped in the normal sterile surgical fashion. Timeout was called verifying side and site of surgery. Preop antibiotics were given with 60 minutes of beginning the procedure.  Fluoroscopy was used to define the patient's anatomy. A 4 cm incision was made just proximal to the tip of the greater trochanter. The awl was used to obtain the standard starting point for a trochanteric entry nail under fluoroscopic control. The guidepin was placed. The entry reamer was used to open the proximal femur.  I placed the guidewire to the level of the physeal scar of the knee. I measured the length of the guidewire. Sequential reaming was performed up to a size 12.5 mm  with excellent chatter. Therefore, a size 11 by 360 mm nail was selected and assembled to the jig on the back table. The nail was placed without any difficulty. Through a separate stab incision, the cannula was placed down to the bone in preparation for the cephalomedullary device. A guidepin was placed into the femoral head using AP and lateral fluoroscopy views. The pin was measured, and then reaming was performed to the appropriate depth. The lag screw was inserted to the appropriate depth. The setscrew was tightened. Using perfect circle technique, a distal interlocking screw was placed. The jig was removed. Final AP and lateral fluoroscopy views were obtained to confirm fracture reduction and hardware placement. Tip apex distance was appropriate. There was no chondral penetration.  The wounds were copiously irrigated with saline. The wound was closed in layers with #1 Vicryl for the fascia, 2-0 Monocryl for the deep dermal layer, and staples for the skin. The patient was then awakened from anesthesia and taken to the PACU in stable condition. Sponge needle and instrument counts were correct at the end of the case 2. There were no known complications.  We will readmit the patient to the hospitalist. Weightbearing status will be weightbearing as tolerated with a walker. We will begin Lovenox for DVT prophylaxis. The patient will work with physical therapy and undergo disposition planning.

## 2018-08-18 NOTE — Discharge Instructions (Signed)
 Dr. Chelsae Zanella Adult Hip & Knee Specialist [facility] [address] [place], [place] [address] [phone]   POSTOPERATIVE DIRECTIONS    Hip Rehabilitation, Guidelines Following Surgery   WEIGHT BEARING Weight bearing as tolerated with assist device (walker, cane, etc) as directed, use it as long as suggested by your surgeon or therapist, typically at least 4-6 weeks.   HOME CARE INSTRUCTIONS  Remove items at home which could result in a fall. This includes throw rugs or furniture in walking pathways.  Continue medications as instructed at time of discharge.  You may have some home medications which will be placed on hold until you complete the course of blood thinner medication.  4 days after discharge, you may start showering. No tub baths or soaking your incisions. Do not put on socks or shoes without following the instructions of your caregivers.   Sit on chairs with arms. Use the chair arms to help push yourself up when arising.  Arrange for the use of a toilet seat elevator so you are not sitting low.   Walk with walker as instructed.  You may resume a sexual relationship in one month or when given the OK by your caregiver.  Use walker as long as suggested by your caregivers.  Avoid periods of inactivity such as sitting longer than an hour when not asleep. This helps prevent blood clots.  You may return to work once you are cleared by your surgeon.  Do not drive a car for 6 weeks or until released by your surgeon.  Do not drive while taking narcotics.  Wear elastic stockings for two weeks following surgery during the day but you may remove then at night.  Make sure you keep all of your appointments after your operation with all of your doctors and caregivers. You should call the office at the above phone number and make an appointment for approximately two weeks after the date of your surgery. Please pick up a stool softener and laxative  for home use as long as you are requiring pain medications.  ICE to the affected hip every three hours for 30 minutes at a time and then as needed for pain and swelling. Continue to use ice on the hip for pain and swelling from surgery. You may notice swelling that will progress down to the foot and ankle.  This is normal after surgery.  Elevate the leg when you are not up walking on it.   It is important for you to complete the blood thinner medication as prescribed by your doctor.  Continue to use the breathing machine which will help keep your temperature down.  It is common for your temperature to cycle up and down following surgery, especially at night when you are not up moving around and exerting yourself.  The breathing machine keeps your lungs expanded and your temperature down.  RANGE OF MOTION AND STRENGTHENING EXERCISES  These exercises are designed to help you keep full movement of your hip joint. Follow your caregiver's or physical therapist's instructions. Perform all exercises about fifteen times, three times per day or as directed. Exercise both hips, even if you have had only one joint replacement. These exercises can be done on a training (exercise) mat, on the floor, on a table or on a bed. Use whatever works the best and is most comfortable for you. Use music or television while you are exercising so that the exercises are a pleasant break in your day. This   will make your life better with the exercises acting as a break in routine you can look forward to.  Lying on your back, slowly slide your foot toward your buttocks, raising your knee up off the floor. Then slowly slide your foot back down until your leg is straight again.  Lying on your back spread your legs as far apart as you can without causing discomfort.  Lying on your side, raise your upper leg and foot straight up from the floor as far as is comfortable. Slowly lower the leg and repeat.  Lying on your back, tighten up the  muscle in the front of your thigh (quadriceps muscles). You can do this by keeping your leg straight and trying to raise your heel off the floor. This helps strengthen the largest muscle supporting your knee.  Lying on your back, tighten up the muscles of your buttocks both with the legs straight and with the knee bent at a comfortable angle while keeping your heel on the floor.   SKILLED REHAB INSTRUCTIONS: If the patient is transferred to a skilled rehab facility following release from the hospital, a list of the current medications will be sent to the facility for the patient to continue.  When discharged from the skilled rehab facility, please have the facility set up the patient's Home Health Physical Therapy prior to being released. Also, the skilled facility will be responsible for providing the patient with their medications at time of release from the facility to include their pain medication and their blood thinner medication. If the patient is still at the rehab facility at time of the two week follow up appointment, the skilled rehab facility will also need to assist the patient in arranging follow up appointment in our office and any transportation needs.  MAKE SURE YOU:  Understand these instructions.  Will watch your condition.  Will get help right away if you are not doing well or get worse.  Pick up stool softner and laxative for home use following surgery while on pain medications. Daily dry dressing changes as needed. In 4 days, you may remove your dressings and begin taking showers - no tub baths or soaking the incisions. Continue to use ice for pain and swelling after surgery. Do not use any lotions or creams on the incision until instructed by your surgeon.   

## 2018-08-18 NOTE — Anesthesia Procedure Notes (Signed)
Procedure Name: Intubation Date/Time: 08/18/2018 5:34 PM Performed by: Vanessa Quanah, CRNA Pre-anesthesia Checklist: Patient identified, Emergency Drugs available, Suction available and Patient being monitored Patient Re-evaluated:Patient Re-evaluated prior to induction Oxygen Delivery Method: Circle system utilized Preoxygenation: Pre-oxygenation with 100% oxygen Induction Type: IV induction Ventilation: Mask ventilation without difficulty Laryngoscope Size: 2 and Miller Grade View: Grade I Tube type: Oral Tube size: 7.0 mm Number of attempts: 1 Airway Equipment and Method: Stylet Placement Confirmation: ETT inserted through vocal cords under direct vision,  positive ETCO2 and breath sounds checked- equal and bilateral Secured at: 21 cm Tube secured with: Tape Dental Injury: Teeth and Oropharynx as per pre-operative assessment

## 2018-08-18 NOTE — ED Notes (Signed)
Report given to [facility]  

## 2018-08-18 NOTE — Consult Note (Signed)
ORTHOPAEDIC CONSULTATION  REQUESTING PHYSICIAN: Cleora Fleet, MD  PCP:  Dettinger, Elige Radon, MD  Chief Complaint: Right hip pain  HPI: Eileen Kidd is a 78 y.o. female who complains of right hip pain after a ground-level fall last night.  She had hip pain and inability to weight-bear.  She presented to the emergency department at [facility] where x-rays revealed a comminuted, displaced right peritrochanteric femur fracture.  She was admitted by the hospitalist service, and transferred to [facility].  She denies other injuries.  Orthopedic consultation was placed for management of her right hip fracture.  Past Medical History:  Diagnosis Date  . Arthritis    left knee  . Endometriosis   . History of fractured kneecap    LEFT  . Hyperlipidemia   . Osteoporosis    Past Surgical History:  Procedure Laterality Date  . ABDOMINAL HYSTERECTOMY Bilateral   . SPINE SURGERY     L5-S1 removal   Social History   Socioeconomic History  . Marital status: Legally Separated    Spouse name: Not on file  . Number of children: Not on file  . Years of education: Not on file  . Highest education level: Not on file  Occupational History  . Not on file  Social Needs  . Financial resource strain: Not on file  . Food insecurity:    Worry: Not on file    Inability: Not on file  . Transportation needs:    Medical: Not on file    Non-medical: Not on file  Tobacco Use  . Smoking status: Current Some Day Smoker    Years: 40.00    Types: Cigarettes  . Smokeless tobacco: Never Used  Substance and Sexual Activity  . Alcohol use: No  . Drug use: No  . Sexual activity: Never  Lifestyle  . Physical activity:    Days per week: Not on file    Minutes per session: Not on file  . Stress: Not on file  Relationships  . Social connections:    Talks on phone: Not on file    Gets together: Not on file    Attends religious service: Not on file    Active member of  club or organization: Not on file    Attends meetings of clubs or organizations: Not on file    Relationship status: Not on file  Other Topics Concern  . Not on file  Social History Narrative  . Not on file   Family History  Problem Relation Age of Onset  . Heart attack Mother   . Arthritis Mother   . Heart attack Father   . Diabetes Brother   . Stroke Brother   . Breast cancer Daughter    Allergies  Allergen Reactions  . Alendronate     espophagitis / GI pain / unsteady gait   Prior to Admission medications   Medication Sig Start Date End Date Taking? Authorizing Provider  denosumab (PROLIA) 60 MG/ML SOLN injection Inject 60 mg into the skin every 6 (six) months. Administer in upper arm, thigh, or abdomen 02/14/17  Yes Dettinger, Elige Radon, MD  Multiple Vitamins-Minerals (MULTIVITAMIN & MINERAL PO) Take 1 tablet by mouth daily.    Yes [provider]  rosuvastatin (CRESTOR) 20 MG tablet TAKE 1 TABLET BY MOUTH EVERY DAY IN THE EVENING 08/10/18  Yes Dettinger, Elige Radon, MD   Dg Chest 1 View  Result Date: 08/18/2018 CLINICAL DATA:  The patient suffered  a right hip fracture in a fall today. EXAM: CHEST  1 VIEW COMPARISON:  PA and lateral chest 07/28/2017. FINDINGS: The lungs are emphysematous. No consolidative process, pneumothorax or effusion. Heart size is normal. Aortic atherosclerosis noted. No acute or focal bony abnormality. IMPRESSION: No acute disease. Emphysema. Atherosclerosis. Electronically Signed   By: Drusilla Kanner M.D.   On: 08/18/2018 13:36   Dg Hip Unilat With Pelvis 2-3 Views Right  Result Date: 08/18/2018 CLINICAL DATA:  Fall with right hip pain. EXAM: DG HIP (WITH OR WITHOUT PELVIS) 2-3V RIGHT COMPARISON:  None. FINDINGS: Frontal pelvis shows diffuse bony demineralization. SI joints and symphysis pubis unremarkable. Left hip unremarkable. Comminuted right intertrochanteric femoral neck fracture with varus angulation evident. IMPRESSION: Comminuted right  intertrochanteric femoral neck fracture with varus angulation. Electronically Signed   By: Kennith Center M.D.   On: 08/18/2018 13:36   Dg Femur 1v Right  Result Date: 08/18/2018 CLINICAL DATA:  Fall with pain. EXAM: RIGHT FEMUR 1 VIEW COMPARISON:  None. FINDINGS: A single view of the right femur was obtained. There is a comminuted right intertrochanteric femoral neck fracture in the main distal femur fraction appears laterally rotated. No mid or distal right femur fracture. IMPRESSION: Comminuted right intertrochanteric femoral neck fracture. No mid or distal femur fracture evident. Electronically Signed   By: Kennith Center M.D.   On: 08/18/2018 13:37    Positive ROS: All other systems have been reviewed and were otherwise negative with the exception of those mentioned in the HPI and as above.  Physical Exam: General: Alert, no acute distress Cardiovascular: No pedal edema Respiratory: No cyanosis, no use of accessory musculature GI: No organomegaly, abdomen is soft and non-tender Skin: No lesions in the area of chief complaint Neurologic: Sensation intact distally Psychiatric: Patient is competent for consent with normal mood and affect Lymphatic: No axillary or cervical lymphadenopathy  MUSCULOSKELETAL: Examination of the right hip reveals no skin wounds or lesions.  She is shortened and externally rotated.  She has pain with attempted logrolling of the hip.  She is neurovascularly intact distally.  Assessment: Comminuted right peritrochanteric femur fracture. Osteoporosis. Tobacco abuse.  Plan: I discussed the findings with the patient and her family.  She has a displaced, comminuted right peritrochanteric femur fracture.  She will require operative stabilization with an intramedullary nail.  We did discuss the risk, benefits, and alternatives.  Please see statement of risk.  Smoking cessation recommended to decrease the risk of nonunion and wound complications.  Plan for hospitalist  admission postoperatively for physical therapy.  All questions solicited and answered.  The risks, benefits, and alternatives were discussed with the patient. There are risks associated with the surgery including, but not limited to, problems with anesthesia (death), infection, differences in leg length/angulation/rotation, fracture of bones, loosening or failure of implants, malunion, nonunion, hematoma (blood accumulation) which may require surgical drainage, blood clots, pulmonary embolism, nerve injury (foot drop), and blood vessel injury. The patient understands these risks and elects to proceed.   Jonette Pesa, MD Cell [phone]    08/18/2018 5:30 PM

## 2018-08-18 NOTE — Anesthesia Preprocedure Evaluation (Addendum)
Anesthesia Evaluation  Patient identified by MRN, date of birth, ID band Patient awake    Reviewed: Allergy & Precautions, NPO status , Patient's Chart, lab work & pertinent test results  Airway Mallampati: I  TM Distance: >3 FB Neck ROM: Full    Dental  (+) Edentulous Upper, Edentulous Lower   Pulmonary Current Smoker,    breath sounds clear to auscultation       Cardiovascular negative cardio ROS   Rhythm:Regular Rate:Normal     Neuro/Psych negative neurological ROS  negative psych ROS   GI/Hepatic negative GI ROS, Neg liver ROS,   Endo/Other  negative endocrine ROS  Renal/GU negative Renal ROS     Musculoskeletal  (+) Arthritis ,   Abdominal Normal abdominal exam  (+)   Peds  Hematology negative hematology ROS (+)   Anesthesia Other Findings   Reproductive/Obstetrics                            Anesthesia Physical Anesthesia Plan  ASA: III  Anesthesia Plan: General   Post-op Pain Management:    Induction: Intravenous  PONV Risk Score and Plan: 3  Airway Management Planned: Oral ETT  Additional Equipment: None  Intra-op Plan:   Post-operative Plan: Extubation in OR  Informed Consent: I have reviewed the patients History and Physical, chart, labs and discussed the procedure including the risks, benefits and alternatives for the proposed anesthesia with the patient or authorized representative who has indicated his/her understanding and acceptance.       Plan Discussed with: CRNA  Anesthesia Plan Comments:        Anesthesia Quick Evaluation

## 2018-08-18 NOTE — Anesthesia Postprocedure Evaluation (Signed)
Anesthesia Post Note  Patient: Eileen Kidd  Procedure(s) Performed: INTRAMEDULLARY (IM) FEMORAL NAILING (Right )     Patient location during evaluation: PACU Anesthesia Type: General Level of consciousness: awake and alert Pain management: pain level controlled Vital Signs Assessment: post-procedure vital signs reviewed and stable Respiratory status: spontaneous breathing, nonlabored ventilation, respiratory function stable and patient connected to nasal cannula oxygen Cardiovascular status: blood pressure returned to baseline and stable Postop Assessment: no apparent nausea or vomiting Anesthetic complications: no    Last Vitals:  Vitals:   08/18/18 1958 08/18/18 2015  BP: (!) (P) 119/54 (!) 127/54  Pulse: (P) 80   Resp: (P) 17 18  Temp: 37.1 C 36.7 C  SpO2: (P) 100% 100%                   Shelton Silvas

## 2018-08-18 NOTE — H&P (Signed)
History and Physical  Eileen EthMonna R Breon ZOX:[id number] DOB: [birth year] DOA: 08/18/2018  Referring physician: Clarene DukeMcManus  PCP: Dettinger, Elige RadonJoshua A, MD   Chief Complaint: Fall   HPI: Eileen Kidd is a 78 y.o. 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  She feel on her right buttock and hip area.  She was unable to stand since the injury.  She denies chest pain, shortness of breath, syncope, numbness/tingling.  She did not pass out and she did not hit her head.  She denies abdominal pain, n/v/d and fever.    ED Course:  Pt was evaluated by ED providers and noted to be in severe right hip pain.  Pt was sent to xray which confirmed acute comminuted right hip fracture.  The patient is followed by orthopedist Dr. Lequita HaltAluisio with [facility].  His on call colleague Dr. Veda CanningSwintek [facility] and for patient to remain NPO and [facility].    Review of Systems: All systems reviewed and apart from history of presenting illness, are negative.  Past Medical History:  Diagnosis Date  . Arthritis    left knee  . Endometriosis   . History of fractured kneecap    LEFT  . Hyperlipidemia   . Osteoporosis    Past Surgical History:  Procedure Laterality Date  . ABDOMINAL HYSTERECTOMY Bilateral   . SPINE SURGERY     L5-S1 removal   Social History:  reports that she has been smoking cigarettes. She has smoked for the past 40.00 years. She has never used smokeless tobacco. She reports that she does not drink alcohol or use drugs.  Allergies  Allergen Reactions  . Alendronate     espophagitis / GI pain / unsteady gait    Family History  Problem Relation Age of Onset  . Heart attack Mother   . Arthritis Mother   . Heart attack Father   . Diabetes  Brother   . Stroke Brother   . Breast cancer Daughter     Prior to Admission medications   Medication Sig Start Date End Date Taking? Authorizing Provider  denosumab (PROLIA) 60 MG/ML SOLN injection Inject 60 mg into the skin every 6 (six) months. Administer in upper arm, thigh, or abdomen 02/14/17  Yes Dettinger, Elige RadonJoshua A, MD  Multiple Vitamins-Minerals (MULTIVITAMIN & MINERAL PO) Take 1 tablet by mouth daily.    Yes [provider]  rosuvastatin (CRESTOR) 20 MG tablet TAKE 1 TABLET BY MOUTH EVERY DAY IN THE EVENING 08/10/18  Yes Dettinger, Elige RadonJoshua A, MD   Physical Exam: Vitals:   08/18/18 1202 08/18/18 1244 08/18/18 1300 08/18/18 1330  BP: (!) 144/59 (!) 140/58 137/68 (!) 128/58  Pulse: 78 84 75 75  Resp: 18  18 17   Temp: 97.9 F (36.6 C)     TempSrc: Oral     SpO2: 100% 98% 97%   Weight:      Height:         General exam: Moderately built and nourished patient, lying comfortably supine on the gurney in no obvious distress.  Head, eyes and ENT: Nontraumatic and normocephalic. Pupils equally reacting to light and accommodation. Oral mucosa dry.  Neck: Supple. No JVD, carotid bruit or thyromegaly.  Lymphatics: No lymphadenopathy.  Respiratory system: Clear to auscultation. No  increased work of breathing.  Cardiovascular system: S1 and S2 heard, RRR. No JVD, murmurs, gallops, clicks or pedal edema.  Gastrointestinal system: Abdomen is nondistended, soft and nontender. Normal bowel sounds heard. No organomegaly or masses appreciated.  Central nervous system: Alert and oriented. No focal neurological deficits.  Extremities: deformed externally rotated shortened RLE. Peripheral pulses symmetrically felt BLEs.  Skin: No rashes or acute findings.  Musculoskeletal system: Negative exam.  Psychiatry: Pleasant and cooperative.  Labs on Admission:  Basic Metabolic Panel: Recent Labs  Lab 08/18/18 1240  NA 142  K 3.7  CL 106  CO2 25  GLUCOSE 123*  BUN 12    CREATININE 0.52  CALCIUM 8.8*   Liver Function Tests: No results for input(s): AST, ALT, ALKPHOS, BILITOT, PROT, ALBUMIN in the last 168 hours. No results for input(s): LIPASE, AMYLASE in the last 168 hours. No results for input(s): AMMONIA in the last 168 hours. CBC: Recent Labs  Lab 08/18/18 1240  WBC 19.2*  NEUTROABS 17.2*  HGB 12.7  HCT 39.3  MCV 94.9  PLT 309   Cardiac Enzymes: Recent Labs  Lab 08/18/18 1240  CKTOTAL 86  TROPONINI <0.03    BNP (last 3 results) No results for input(s): PROBNP in the last 8760 hours. CBG: No results for input(s): GLUCAP in the last 168 hours.  Radiological Exams on Admission: Dg Chest 1 View  Result Date: 08/18/2018 CLINICAL DATA:  The patient suffered a right hip fracture in a fall today. EXAM: CHEST  1 VIEW COMPARISON:  PA and lateral chest 07/28/2017. FINDINGS: The lungs are emphysematous. No consolidative process, pneumothorax or effusion. Heart size is normal. Aortic atherosclerosis noted. No acute or focal bony abnormality. IMPRESSION: No acute disease. Emphysema. Atherosclerosis. Electronically Signed   By: Drusilla Kanner M.D.   On: 08/18/2018 13:36   Dg Hip Unilat With Pelvis 2-3 Views Right  Result Date: 08/18/2018 CLINICAL DATA:  Fall with right hip pain. EXAM: DG HIP (WITH OR WITHOUT PELVIS) 2-3V RIGHT COMPARISON:  None. FINDINGS: Frontal pelvis shows diffuse bony demineralization. SI joints and symphysis pubis unremarkable. Left hip unremarkable. Comminuted right intertrochanteric femoral neck fracture with varus angulation evident. IMPRESSION: Comminuted right intertrochanteric femoral neck fracture with varus angulation. Electronically Signed   By: Kennith Center M.D.   On: 08/18/2018 13:36   Dg Femur 1v Right  Result Date: 08/18/2018 CLINICAL DATA:  Fall with pain. EXAM: RIGHT FEMUR 1 VIEW COMPARISON:  None. FINDINGS: A single view of the right femur was obtained. There is a comminuted right intertrochanteric femoral  neck fracture in the main distal femur fraction appears laterally rotated. No mid or distal right femur fracture. IMPRESSION: Comminuted right intertrochanteric femoral neck fracture. No mid or distal femur fracture evident. Electronically Signed   By: Kennith Center M.D.   On: 08/18/2018 13:37   EKG: Independently reviewed. NSR. PACs.   Assessment/Plan Principal Problem:   Closed right hip fracture (HCC) Active Problems:   Osteoporosis   Dyslipidemia   Arthritis   Leukocytosis   Right hip pain  1. Comminuted right intertrochanteric femoral neck fracture - Admit to [facility] as requested by Dr. Veda Canning. Hip fracture order set initiated.  Keep NPO per strict instructions from surgeon.  IV pain and nausea meds ordered.  Gentle hydration ordered.  Pt medically cleared for surgery.  Operative management per orthopedics team.  2. Osteoporosis - Pt is being managed by her PCP Dr. Louanne Skye.  3. Dyslipidemia - resume crestor when able to take p.o. again.  4. Leukocytosis - reactive from recent fall and hip fracture - follow CBC.  No s/s of infection found.  5. Acute right hip pain - IV pain and nausea medications ordered.  6. Osteoarthritis - pain management ordered.  DVT Prophylaxis: subcutaneous heparin  Code Status: Full   Family Communication: daughter at bedside   Disposition Plan: transfer to [facility] for operative management    Time spent: 57 minutes   Standley Dakins, MD [facility] How to contact the [facility] Attending or Consulting provider 7A - 7P or covering provider during after hours 7P -7A, for this patient?  1. Check the care team in [facility] and look for a) attending/consulting [facility] provider listed and b) the [facility] team listed 2. Log into [email or web address] and use [place]'s universal password to access. If you do not have the password, please contact the hospital operator. 3. Locate the [facility] provider you are looking for under [facility] and page to a number  that you can be directly reached. 4. If you still have difficulty reaching the provider, please page the [facility] (Director on Call) for the Hospitalists listed on amion for assistance.

## 2018-08-18 NOTE — ED Triage Notes (Signed)
Pt states she slipped going to the bathroom last night and could not get up due to right hip pain.  Significant deformity to upper femur with shortening and rotation.  Pedal pulse marked.

## 2018-08-18 NOTE — ED Notes (Signed)
purewick placed

## 2018-08-18 NOTE — ED Provider Notes (Signed)
[facility] EMERGENCY DEPARTMENT Provider Note   CSN: [id number] Arrival date & time: 08/18/18  1157    History   Chief Complaint Chief Complaint  Patient presents with  . Fall    HPI Eileen Kidd is a 78 y.o. female.     HPI  Pt was seen at 1215. Per EMS and pt report, c/o sudden onset and persistence of constant right hip "pain" that began after she fell last night. Pt states she was walking to the bathroom and fell onto her buttocks/right hip area. States she was unable to stand up due to the pain. Pt states she "thinks I broke my hip." Denies focal motor weakness, no tingling/numbness in extremities, no head injury, no neck or back pain, no CP/SOB, no abd pain, no N/V/D, no syncope/LOC, no AMS.   Ortho: Dr. Lequita Halt Past Medical History:  Diagnosis Date  . Arthritis    left knee  . Endometriosis   . History of fractured kneecap    LEFT  . Hyperlipidemia   . Osteoporosis     Patient Active Problem List   Diagnosis Date Noted  . Dyslipidemia 01/21/2016  . Osteoporosis 04/16/2015    Past Surgical History:  Procedure Laterality Date  . ABDOMINAL HYSTERECTOMY Bilateral   . SPINE SURGERY     L5-S1 removal     OB History   No obstetric history on file.      Home Medications    Prior to Admission medications   Medication Sig Start Date End Date Taking? Authorizing Provider  denosumab (PROLIA) 60 MG/ML SOLN injection Inject 60 mg into the skin every 6 (six) months. Administer in upper arm, thigh, or abdomen 02/14/17  Yes Dettinger, Elige Radon, MD  Multiple Vitamins-Minerals (MULTIVITAMIN & MINERAL PO) Take 1 tablet by mouth daily.    Yes [provider]  rosuvastatin (CRESTOR) 20 MG tablet TAKE 1 TABLET BY MOUTH EVERY DAY IN THE EVENING 08/10/18  Yes Dettinger, Elige Radon, MD    Family History Family History  Problem Relation Age of Onset  . Heart attack Mother   . Arthritis Mother   . Heart attack Father   . Diabetes Brother   . Stroke Brother     . Breast cancer Daughter     Social History Social History   Tobacco Use  . Smoking status: Current Some Day Smoker    Years: 40.00    Types: Cigarettes  . Smokeless tobacco: Never Used  Substance Use Topics  . Alcohol use: No  . Drug use: No     Allergies   Alendronate   Review of Systems Review of Systems ROS: Statement: All systems negative except as marked or noted in the HPI; Constitutional: Negative for fever and chills. ; ; Eyes: Negative for eye pain, redness and discharge. ; ; ENMT: Negative for ear pain, hoarseness, nasal congestion, sinus pressure and sore throat. ; ; Cardiovascular: Negative for chest pain, palpitations, diaphoresis, dyspnea and peripheral edema. ; ; Respiratory: Negative for cough, wheezing and stridor. ; ; Gastrointestinal: Negative for nausea, vomiting, diarrhea, abdominal pain, blood in stool, hematemesis, jaundice and rectal bleeding. . ; ; Genitourinary: Negative for dysuria, flank pain and hematuria. ; ; Musculoskeletal: +right hip pain. Negative for back pain and neck pain. Negative for swelling.; ; Skin: Negative for pruritus, rash, abrasions, blisters, bruising and skin lesion.; ; Neuro: Negative for headache, lightheadedness and neck stiffness. Negative for weakness, altered level of consciousness, altered mental status, extremity weakness, paresthesias, involuntary movement, seizure  and syncope.       Physical Exam Updated Vital Signs BP (!) 140/58 (BP Location: Right Arm)   Pulse 84   Temp 97.9 F (36.6 C) (Oral)   Resp 18   Ht 5\' 2"  (1.575 m)   Wt 43.5 kg   SpO2 98%   BMI 17.56 kg/m   Physical Exam 1220: Physical examination:  Nursing notes reviewed; Vital signs and O2 SAT reviewed;  Constitutional: Well developed, Well nourished, Well hydrated, In no acute distress; Head:  Normocephalic, atraumatic; Eyes: EOMI, PERRL, No scleral icterus; ENMT: Mouth and pharynx normal, Mucous membranes moist; Neck: Supple, Full range of motion,  No lymphadenopathy; Cardiovascular: Regular rate and rhythm, No gallop; Respiratory: Breath sounds clear & equal bilaterally, No wheezes.  Speaking full sentences with ease, Normal respiratory effort/excursion; Chest: Nontender, Movement normal; Abdomen: Soft, Nontender, Nondistended, Normal bowel sounds; Genitourinary: No CVA tenderness; Spine:  No midline CS, TS, LS tenderness.;; Extremities: Peripheral pulses normal, Pelvis stable. +right hip and proximal femur area with tenderness, edema, obvious deformity. RLE is shortened and externally rotated. NMS intact distally right leg. NT right knee/ankle/foot. No edema, No calf asymmetry.; Neuro: AA&Ox3, Major CN grossly intact.  Speech clear. No gross focal motor or sensory deficits in extremities.; Skin: Color normal, Warm, Dry.   ED Treatments / Results  Labs (all labs ordered are listed, but only abnormal results are displayed)   EKG EKG Interpretation  Date/Time:  Saturday August 18 2018 12:30:59 EST Ventricular Rate:  72 PR Interval:    QRS Duration: 93 QT Interval:  406 QTC Calculation: 445 R Axis:   79 Text Interpretation:  Sinus rhythm Atrial premature complex RSR' in V1 or V2, probably normal variant Nonspecific T abnormalities, lateral leads No old tracing to compare Confirmed by Samuel Jester [phone]) on 08/18/2018 12:43:41 PM   Radiology   Procedures Procedures (including critical care time)  Medications Ordered in ED Medications  morphine 4 MG/ML injection 4 mg (4 mg Intravenous Given 08/18/18 1243)  ondansetron (ZOFRAN) injection 4 mg (4 mg Intravenous Given 08/18/18 1242)  morphine 2 MG/ML injection 2 mg (2 mg Intravenous Given 08/18/18 1211)     Initial Impression / Assessment and Plan / ED Course  I have reviewed the triage vital signs and the nursing notes.  Pertinent labs & imaging results that were available during my care of the patient were reviewed by me and considered in my medical decision making (see  chart for details).     MDM Reviewed: previous chart, nursing note and vitals Reviewed previous: labs and ECG Interpretation: labs, ECG and x-ray   Results for orders placed or performed during the hospital encounter of 08/18/18  Basic metabolic panel  Result Value Ref Range   Sodium 142 135 - 145 mmol/L   Potassium 3.7 3.5 - 5.1 mmol/L   Chloride 106 98 - 111 mmol/L   CO2 25 22 - 32 mmol/L   Glucose, Bld 123 (H) 70 - 99 mg/dL   BUN 12 8 - 23 mg/dL   Creatinine, Ser 9.29 0.44 - 1.00 mg/dL   Calcium 8.8 (L) 8.9 - 10.3 mg/dL   GFR calc non Af Amer >60 >60 mL/min   GFR calc Af Amer >60 >60 mL/min   Anion gap 11 5 - 15  Troponin I - Once  Result Value Ref Range   Troponin I <0.03 <0.03 ng/mL  CBC with Differential  Result Value Ref Range   WBC 19.2 (H) 4.0 - 10.5 K/uL  RBC 4.14 3.87 - 5.11 MIL/uL   Hemoglobin 12.7 12.0 - 15.0 g/dL   HCT 16.1 09.6 - 04.5 %   MCV 94.9 80.0 - 100.0 fL   MCH 30.7 26.0 - 34.0 pg   MCHC 32.3 30.0 - 36.0 g/dL   RDW 40.9 81.1 - 91.4 %   Platelets 309 150 - 400 K/uL   nRBC 0.0 0.0 - 0.2 %   Neutrophils Relative % 89 %   Neutro Abs 17.2 (H) 1.7 - 7.7 K/uL   Lymphocytes Relative 5 %   Lymphs Abs 0.9 0.7 - 4.0 K/uL   Monocytes Relative 5 %   Monocytes Absolute 0.9 0.1 - 1.0 K/uL   Eosinophils Relative 0 %   Eosinophils Absolute 0.0 0.0 - 0.5 K/uL   Basophils Relative 0 %   Basophils Absolute 0.1 0.0 - 0.1 K/uL   Immature Granulocytes 1 %   Abs Immature Granulocytes 0.10 (H) 0.00 - 0.07 K/uL  CK  Result Value Ref Range   Total CK 86 38 - 234 U/L  Protime-INR  Result Value Ref Range   Prothrombin Time 12.3 11.4 - 15.2 seconds   INR 0.92    Dg Chest 1 View Result Date: 08/18/2018 CLINICAL DATA:  The patient suffered a right hip fracture in a fall today. EXAM: CHEST  1 VIEW COMPARISON:  PA and lateral chest 07/28/2017. FINDINGS: The lungs are emphysematous. No consolidative process, pneumothorax or effusion. Heart size is normal. Aortic  atherosclerosis noted. No acute or focal bony abnormality. IMPRESSION: No acute disease. Emphysema. Atherosclerosis. Electronically Signed   By: Drusilla Kanner M.D.   On: 08/18/2018 13:36   Dg Hip Unilat With Pelvis 2-3 Views Right Result Date: 08/18/2018 CLINICAL DATA:  Fall with right hip pain. EXAM: DG HIP (WITH OR WITHOUT PELVIS) 2-3V RIGHT COMPARISON:  None. FINDINGS: Frontal pelvis shows diffuse bony demineralization. SI joints and symphysis pubis unremarkable. Left hip unremarkable. Comminuted right intertrochanteric femoral neck fracture with varus angulation evident. IMPRESSION: Comminuted right intertrochanteric femoral neck fracture with varus angulation. Electronically Signed   By: Kennith Center M.D.   On: 08/18/2018 13:36   Dg Femur 1v Right Result Date: 08/18/2018 CLINICAL DATA:  Fall with pain. EXAM: RIGHT FEMUR 1 VIEW COMPARISON:  None. FINDINGS: A single view of the right femur was obtained. There is a comminuted right intertrochanteric femoral neck fracture in the main distal femur fraction appears laterally rotated. No mid or distal right femur fracture. IMPRESSION: Comminuted right intertrochanteric femoral neck fracture. No mid or distal femur fracture evident. Electronically Signed   By: Kennith Center M.D.   On: 08/18/2018 13:37    1400:  XR as above. Pt and family requesting Dr. Deri Fuelling group. T/C returned from [facility] Dr. Linna Caprice, case discussed, including:  HPI, pertinent PM/SHx, VS/PE, dx testing, ED course and treatment:  Agreeable to consult, requests to keep NPO, admit to [facility] to [facility].   1410:  T/C returned from [facility] Dr. Laural Benes, case discussed, including:  HPI, pertinent PM/SHx, VS/PE, dx testing, ED course and treatment:  Agreeable to admit.     Final Clinical Impressions(s) / ED Diagnoses   Final diagnoses:  None    ED Discharge Orders    None       Samuel Jester, DO 08/22/18 7829

## 2018-08-18 NOTE — Transfer of Care (Signed)
Immediate Anesthesia Transfer of Care Note  Patient: Eileen Kidd  Procedure(s) Performed: Procedure(s): INTRAMEDULLARY (IM) FEMORAL NAILING (Right)  Patient Location: PACU  Anesthesia Type:General  Level of Consciousness:  sedated, patient cooperative and responds to stimulation  Airway & Oxygen Therapy:Patient Spontanous Breathing and Patient connected to face mask oxgen  Post-op Assessment:  Report given to PACU RN and Post -op Vital signs reviewed and stable  Post vital signs:  Reviewed and stable  Last Vitals:  Vitals:   08/18/18 1500 08/18/18 1537  BP: 136/72 132/61  Pulse: 80 89  Resp: 19 15  Temp:  37 C  SpO2:  100%    Complications: No apparent anesthesia complications

## 2018-08-19 ENCOUNTER — Other Ambulatory Visit: Payer: Self-pay

## 2018-08-19 DIAGNOSIS — S72141A Displaced intertrochanteric fracture of right femur, initial encounter for closed fracture: Secondary | ICD-10-CM

## 2018-08-19 LAB — CBC
HCT: 28.6 % — ABNORMAL LOW (ref 36.0–46.0)
Hemoglobin: 8.7 g/dL — ABNORMAL LOW (ref 12.0–15.0)
MCH: 30.1 pg (ref 26.0–34.0)
MCHC: 30.4 g/dL (ref 30.0–36.0)
MCV: 99 fL (ref 80.0–100.0)
Platelets: 225 10*3/uL (ref 150–400)
RBC: 2.89 MIL/uL — ABNORMAL LOW (ref 3.87–5.11)
RDW: 13.8 % (ref 11.5–15.5)
WBC: 12.3 10*3/uL — ABNORMAL HIGH (ref 4.0–10.5)
nRBC: 0 % (ref 0.0–0.2)

## 2018-08-19 LAB — BASIC METABOLIC PANEL
Anion gap: 9 (ref 5–15)
BUN: 15 mg/dL (ref 8–23)
CO2: 22 mmol/L (ref 22–32)
Calcium: 7.3 mg/dL — ABNORMAL LOW (ref 8.9–10.3)
Chloride: 109 mmol/L (ref 98–111)
Creatinine, Ser: 0.72 mg/dL (ref 0.44–1.00)
GFR calc non Af Amer: >60 mL/min (ref >60)
Glucose, Bld: 162 mg/dL — ABNORMAL HIGH (ref 70–99)
Potassium: 3.9 mmol/L (ref 3.5–5.1)
Sodium: 140 mmol/L (ref 135–145)

## 2018-08-19 MED ORDER — ENSURE ENLIVE PO LIQD
237.0000 mL | Freq: Two times a day (BID) | ORAL | Status: DC
  Administered 2018-08-20 – 2018-08-22 (×5): 237 mL via ORAL

## 2018-08-19 MED ORDER — HYDROCODONE-ACETAMINOPHEN 5-325 MG PO TABS: 1.0000 | ORAL_TABLET | Freq: Four times a day (QID) | ORAL | Status: DC | PRN

## 2018-08-19 MED ORDER — ROSUVASTATIN CALCIUM 20 MG PO TABS
20.0000 mg | ORAL_TABLET | Freq: Every day | ORAL | Status: DC
  Administered 2018-08-20 – 2018-08-21 (×2): 20 mg via ORAL
  Filled 2018-08-19 (×4): qty 1

## 2018-08-19 MED ORDER — METOCLOPRAMIDE HCL 5 MG PO TABS: 5.0000 mg | ORAL_TABLET | Freq: Three times a day (TID) | ORAL | Status: DC | PRN

## 2018-08-19 MED ORDER — HYDROCODONE-ACETAMINOPHEN 5-325 MG PO TABS
1.0000 | ORAL_TABLET | ORAL | Status: DC | PRN
  Administered 2018-08-19 (×2): 2 via ORAL
  Filled 2018-08-19: qty 1
  Filled 2018-08-19 (×2): qty 2

## 2018-08-19 MED ORDER — ONDANSETRON HCL 4 MG PO TABS: 4.0000 mg | ORAL_TABLET | Freq: Four times a day (QID) | ORAL | Status: DC | PRN

## 2018-08-19 MED ORDER — HYDROCODONE-ACETAMINOPHEN 7.5-325 MG PO TABS: 1.0000 | ORAL_TABLET | ORAL | Status: DC | PRN

## 2018-08-19 MED ORDER — SODIUM CHLORIDE 0.9 % IV SOLN: INTRAVENOUS | Status: DC

## 2018-08-19 MED ORDER — MENTHOL 3 MG MT LOZG: 1.0000 | LOZENGE | OROMUCOSAL | Status: DC | PRN

## 2018-08-19 MED ORDER — ONDANSETRON HCL 4 MG/2ML IJ SOLN: 4.0000 mg | Freq: Four times a day (QID) | INTRAMUSCULAR | Status: DC | PRN

## 2018-08-19 MED ORDER — METOCLOPRAMIDE HCL 5 MG/ML IJ SOLN: 5.0000 mg | Freq: Three times a day (TID) | INTRAMUSCULAR | Status: DC | PRN

## 2018-08-19 MED ORDER — ACETAMINOPHEN 325 MG PO TABS
325.0000 mg | ORAL_TABLET | Freq: Four times a day (QID) | ORAL | Status: DC | PRN
  Administered 2018-08-19 – 2018-08-21 (×6): 650 mg via ORAL
  Filled 2018-08-19 (×6): qty 2

## 2018-08-19 MED ORDER — MORPHINE SULFATE (PF) 2 MG/ML IV SOLN: 0.5000 mg | INTRAVENOUS | Status: DC | PRN

## 2018-08-19 MED ORDER — SENNA 8.6 MG PO TABS
1.0000 | ORAL_TABLET | Freq: Two times a day (BID) | ORAL | Status: DC
  Administered 2018-08-19 – 2018-08-22 (×7): 8.6 mg via ORAL
  Filled 2018-08-19 (×7): qty 1

## 2018-08-19 MED ORDER — SENNOSIDES-DOCUSATE SODIUM 8.6-50 MG PO TABS: 1.0000 | ORAL_TABLET | Freq: Every evening | ORAL | Status: DC | PRN

## 2018-08-19 MED ORDER — ENOXAPARIN SODIUM 40 MG/0.4ML ~~LOC~~ SOLN
40.0000 mg | SUBCUTANEOUS | Status: DC
  Administered 2018-08-19: 40 mg via SUBCUTANEOUS
  Filled 2018-08-19: qty 0.4

## 2018-08-19 MED ORDER — DOCUSATE SODIUM 100 MG PO CAPS
100.0000 mg | ORAL_CAPSULE | Freq: Two times a day (BID) | ORAL | Status: DC
  Administered 2018-08-19 – 2018-08-22 (×7): 100 mg via ORAL
  Filled 2018-08-19 (×7): qty 1

## 2018-08-19 MED ORDER — ENOXAPARIN SODIUM 30 MG/0.3ML ~~LOC~~ SOLN
30.0000 mg | SUBCUTANEOUS | Status: DC
  Administered 2018-08-20 – 2018-08-22 (×3): 30 mg via SUBCUTANEOUS
  Filled 2018-08-19 (×3): qty 0.3

## 2018-08-19 MED ORDER — CEFAZOLIN SODIUM-DEXTROSE 2-4 GM/100ML-% IV SOLN
2.0000 g | Freq: Four times a day (QID) | INTRAVENOUS | Status: AC
  Administered 2018-08-19 (×2): 2 g via INTRAVENOUS
  Filled 2018-08-19 (×2): qty 100

## 2018-08-19 MED ORDER — HEPARIN SODIUM (PORCINE) 5000 UNIT/ML IJ SOLN: 5000.0000 [IU] | Freq: Three times a day (TID) | INTRAMUSCULAR | Status: DC

## 2018-08-19 MED ORDER — PHENOL 1.4 % MT LIQD: 1.0000 | OROMUCOSAL | Status: DC | PRN

## 2018-08-19 MED ORDER — FAMOTIDINE IN NACL 20-0.9 MG/50ML-% IV SOLN
20.0000 mg | Freq: Two times a day (BID) | INTRAVENOUS | Status: AC
  Administered 2018-08-19 (×3): 20 mg via INTRAVENOUS
  Filled 2018-08-19 (×3): qty 50

## 2018-08-19 MED ORDER — KETOROLAC TROMETHAMINE 15 MG/ML IJ SOLN
7.5000 mg | Freq: Four times a day (QID) | INTRAMUSCULAR | Status: AC
  Administered 2018-08-19 (×4): 7.5 mg via INTRAVENOUS
  Filled 2018-08-19 (×4): qty 1

## 2018-08-19 NOTE — Evaluation (Signed)
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  Pt s/p right femoral IM nail 08/18/18.  Clinical Impression  Pt admitted with above diagnosis. Pt currently with functional limitations due to the deficits listed below (see PT Problem List). Pt will benefit from skilled PT to increase their independence and safety with mobility to allow discharge to the venue listed below.  Pt ambulated in room to doorway however LEs buckling, and pt fatigued quickly so assisted to recliner.  Pt living alone and modified independent at baseline.  Recommend SNF upon d/c at this time.     Follow Up Recommendations SNF    Equipment Recommendations  Rolling walker with 5" wheels    Recommendations for Other Services       Precautions / Restrictions Precautions Precautions: Fall Restrictions Other Position/Activity Restrictions: WBAT      Mobility  Bed Mobility Overal bed mobility: Needs Assistance Bed Mobility: Supine to Sit     Supine to sit: HOB elevated;Min guard     General bed mobility comments: verbal cues for sequence  Transfers Overall transfer level: Needs assistance Equipment used: Rolling walker (2 wheeled) Transfers: Sit to/from Stand Sit to Stand: Mod assist         General transfer comment: assist to rise and steady, posterior lean on standing and able to correct with cues  Ambulation/Gait Ambulation/Gait assistance: Mod assist Gait Distance (Feet): 8 Feet Assistive device: Rolling walker (2 wheeled) Gait Pattern/deviations: Step-to pattern;Decreased stance time - right;Antalgic     General Gait Details:  verbal cues for sequence, RW positioning, step length; occasionally LE buckling requiring more assist for balance; recliner following closely for safety  Stairs            Wheelchair Mobility    Modified Rankin (Stroke Patients Only)       Balance Overall balance assessment: History of Falls;Needs assistance         Standing balance support: Bilateral upper extremity supported Standing balance-Leahy Scale: Poor Standing balance comment: requires UE support and slight assist for static standing                             Pertinent Vitals/Pain Pain Assessment: 0-10 Pain Score: 6  Pain Location: R hip/thigh Pain Descriptors / Indicators: Aching;Sore;Tender Pain Intervention(s): Premedicated before session;Monitored during session;Limited activity within patient's tolerance;Repositioned;Ice applied    Home Living Family/patient expects to be discharged to:: Skilled nursing facility Living Arrangements: Alone                    Prior Function Level of Independence: Independent with assistive device(s)         Comments: daughter reports pt should be using at least a [facility] however does not, pt prefers to be as independent as possible     Hand Dominance        Extremity/Trunk Assessment        Lower Extremity Assessment Lower Extremity Assessment: RLE deficits/detail RLE Deficits / Details: grossly 2+/5 hip strength, fair quad contraction, able to perform ankle pumps       Communication   Communication: No difficulties  Cognition Arousal/Alertness: Awake/alert Behavior During Therapy: WFL for tasks assessed/performed Overall Cognitive Status: Within Functional Limits for tasks assessed                                        General Comments      Exercises     Assessment/Plan    PT Assessment Patient needs continued PT services  PT Problem List Decreased strength;Decreased mobility;Decreased activity  tolerance;Pain;Decreased balance;Decreased knowledge of use of DME       PT Treatment Interventions DME instruction;Functional mobility training;Gait training;Therapeutic activities;Patient/family education;Therapeutic exercise;Balance training;Neuromuscular re-education    PT Goals (Current goals can be found in the Care Plan section)  Acute Rehab PT Goals PT Goal Formulation: With patient/family Time For Goal Achievement: 08/26/18 Potential to Achieve Goals: Good    Frequency Min 3X/week   Barriers to discharge        Co-evaluation               AM-PAC PT "6 Clicks" Mobility  Outcome Measure Help needed turning from your back to your side while in a flat bed without using bedrails?: A Little Help needed moving from lying on your back to sitting on the side of a flat bed without using bedrails?: A Little Help needed moving to and from a bed to a chair (including a wheelchair)?: A Little Help needed standing up from a chair using your arms (Kidd.g., wheelchair or bedside chair)?: A Lot Help needed to walk in hospital room?: A Lot Help needed climbing 3-5 steps with a railing? : Total 6 Click Score: 14    End of Session Equipment Utilized During Treatment: Gait belt Activity Tolerance: Patient tolerated treatment well Patient left: in chair;with call bell/phone within reach;with chair alarm set;with family/visitor present Nurse Communication: Mobility status PT Visit Diagnosis: Other abnormalities of gait and mobility (R26.89)    Time: 7048-8891 PT Time Calculation (min) (ACUTE ONLY): 14 min   Charges:   PT Evaluation $PT Eval Low Complexity: 1 Low         Eileen Kidd,Eileen Kidd 08/19/2018, 1:58 PM Eileen Kidd, PT, DPT Acute Rehabilitation Services Office: [phone] Pager: [phone]

## 2018-08-19 NOTE — Clinical Social Work Note (Signed)
Clinical Social Work Assessment  Patient Details  Name: Eileen Kidd MRN: [id number] Date of Birth: 05/12/1941  Date of referral:  08/19/18               Reason for consult:  Facility Placement                Permission sought to share information with:  Family Supports Permission granted to share information::  Yes, Verbal Permission Granted  Name::     daughter Eileen Kidd [phone]  Agency::     Relationship::     Contact Information:     Housing/Transportation Living arrangements for the past 2 months:  Single Family Home Source of Information:  Patient, Adult Children Patient Interpreter Needed:  None Criminal Activity/Legal Involvement Pertinent to Current Situation/Hospitalization:  No - Comment as needed Significant Relationships:  Adult Children, Friend Lives with:  Self Do you feel safe going back to the place where you live?  Yes Need for family participation in patient care:  Yes (Comment)(daughter)  Care giving concerns:  Pt admitted from home where she resides alone, had a fall and sustained hip fracture, had surgery yesterday.  At home is fairly independent however she and daughter report feeling she is becoming more unstable- they are considering having pt move into a [place] or ALF in future.   Social Worker assessment / plan:  CSW consulted to assist with SNF placement for short term therapy following hip fracture. Met with pt and her daughter at bedside- both agreeable to pursuing SNF, pt lives in [place] but requests [place] facility if available as pt's daughter and other family lives here.  Made referrals and will follow up with bed offers- explained [facility] medicare prior approval will be needed.  Employment status:  Retired Engineer, maintenance (IT), Agricultural consultant) PT Recommendations:  [place] / Referral to community resources:  [place]  Patient/Family's Response to care:   appreciative  Patient/Family's Understanding of and Emotional Response to Diagnosis, Current Treatment, and Prognosis:  Pt and daughter are good historians of pt's care and health. Emotionally hopeful that pt will recover well in SNF and be able to determine whether she will go back to her home or move into a facility in the future.  Emotional Assessment Appearance:  Appears stated age, Well-Groomed Attitude/Demeanor/Rapport:  Engaged Affect (typically observed):  Accepting, Adaptable, Calm Orientation:  Oriented to Self, Oriented to Place, Oriented to  Time, Oriented to Situation Alcohol / Substance use:  Not Applicable Psych involvement (Current and /or in the community):  No (Comment)  Discharge Needs  Concerns to be addressed:  Discharge Planning Concerns Readmission within the last 30 days:  No Current discharge risk:  Lives alone Barriers to Discharge:  Ship broker, Continued Medical Work up   [employer], LCSW 08/19/2018, 3:08 PM [phone] weekend coverage for [phone]

## 2018-08-19 NOTE — NC FL2 (Signed)
[place] MEDICAID FL2 LEVEL OF CARE SCREENING TOOL     IDENTIFICATION  Patient Name: Eileen Kidd Birthdate: 29-Aug-1940 Sex: female Admission Date (Current Location): 08/18/2018  [facility] and [place] Number:  Producer, television/film/video and Address:  [facility],  [address], [place] [address]      Provider Number: [id number]  Attending Physician Name and Address:  [place], Alvira Philips, DO  Relative Name and Phone Number:       Current Level of Care: Hospital Recommended Level of Care: Skilled Nursing Facility Prior Approval Number:    Date Approved/Denied:   PASRR Number:   [id number]  Discharge Plan: SNF    Current Diagnoses: Patient Active Problem List   Diagnosis Date Noted  . Closed right hip fracture (HCC) 08/18/2018  . Arthritis 08/18/2018  . Leukocytosis 08/18/2018  . Right hip pain 08/18/2018  . Closed comminuted intertrochanteric fracture of right femur (HCC) 08/18/2018  . Dyslipidemia 01/21/2016  . Osteoporosis 04/16/2015    Orientation RESPIRATION BLADDER Height & Weight     Self, Time, Situation, Place  Normal External catheter Weight: 96 lb (43.5 kg) Height:  5\' 2"  (157.5 cm)  BEHAVIORAL SYMPTOMS/MOOD NEUROLOGICAL BOWEL NUTRITION STATUS        Diet(Regular)  AMBULATORY STATUS COMMUNICATION OF NEEDS Skin   Limited Assist Verbally Other (Comment)(Closed incision right hip)                       Personal Care Assistance Level of Assistance  Feeding, Dressing, Bathing Bathing Assistance: Limited assistance Feeding assistance: Independent Dressing Assistance: Limited assistance     Functional Limitations Info             SPECIAL CARE FACTORS FREQUENCY  OT (By licensed OT), PT (By licensed PT)     PT Frequency: 5 OT Frequency: 5            Contractures      Additional Factors Info  Code Status, Allergies Code Status Info: Full Allergies Info: ALENDRONATE            Current Medications (08/19/2018):  This is  the current hospital active medication list Current Facility-Administered Medications  Medication Dose Route Frequency Provider Last Rate Last Dose  . acetaminophen (TYLENOL) tablet 325-650 mg  325-650 mg Oral Q6H PRN Swinteck, Arlys John, MD      . docusate sodium (COLACE) capsule 100 mg  100 mg Oral BID Samson Frederic, MD   100 mg at 08/19/18 0826  . [START ON 08/20/2018] enoxaparin (LOVENOX) injection 30 mg  30 mg Subcutaneous Q24H [place], Eric J, DO      . famotidine (PEPCID) IVPB 20 mg premix  20 mg Intravenous Q12H Cleora Fleet, MD   Stopped at 08/19/18 0850  . feeding supplement (ENSURE ENLIVE) (ENSURE ENLIVE) liquid 237 mL  237 mL Oral BID BM [place], Alvira Philips, DO      . HYDROcodone-acetaminophen (NORCO) 7.5-325 MG per tablet 1-2 tablet  1-2 tablet Oral Q4H PRN Swinteck, Arlys John, MD      . HYDROcodone-acetaminophen (NORCO/VICODIN) 5-325 MG per tablet 1-2 tablet  1-2 tablet Oral Q4H PRN Samson Frederic, MD   2 tablet at 08/19/18 [phone]  . ketorolac (TORADOL) 15 MG/ML injection 7.5 mg  7.5 mg Intravenous Q6H Swinteck, Arlys John, MD   7.5 mg at 08/19/18 1148  . menthol-cetylpyridinium (CEPACOL) lozenge 3 mg  1 lozenge Oral PRN Samson Frederic, MD       Or  . phenol (CHLORASEPTIC) mouth  spray 1 spray  1 spray Mouth/Throat PRN Swinteck, Arlys John, MD      . methocarbamol (ROBAXIN) tablet 500 mg  500 mg Oral Q6H PRN Samson Frederic, MD   500 mg at 08/19/18 8003   Or  . methocarbamol (ROBAXIN) 500 mg in dextrose 5 % 50 mL IVPB  500 mg Intravenous Q6H PRN Swinteck, Arlys John, MD      . metoCLOPramide (REGLAN) tablet 5-10 mg  5-10 mg Oral Q8H PRN Swinteck, Arlys John, MD       Or  . metoCLOPramide (REGLAN) injection 5-10 mg  5-10 mg Intravenous Q8H PRN Swinteck, Arlys John, MD      . morphine 2 MG/ML injection 0.5-1 mg  0.5-1 mg Intravenous Q2H PRN Swinteck, Arlys John, MD      . ondansetron (ZOFRAN) tablet 4 mg  4 mg Oral Q6H PRN Swinteck, Arlys John, MD       Or  . ondansetron (ZOFRAN) injection 4 mg  4 mg Intravenous Q6H PRN  Swinteck, Arlys John, MD      . rosuvastatin (CRESTOR) tablet 20 mg  20 mg Oral Daily [place], Eric J, DO      . senna (SENOKOT) tablet 8.6 mg  1 tablet Oral BID Samson Frederic, MD   8.6 mg at 08/19/18 0824  . senna-docusate (Senokot-S) tablet 1 tablet  1 tablet Oral QHS PRN Cleora Fleet, MD         Discharge Medications: Please see discharge summary for a list of discharge medications.  Relevant Imaging Results:  Relevant Lab Results:   Additional Information [id number]  Donnie Coffin, LCSW

## 2018-08-19 NOTE — Progress Notes (Signed)
PROGRESS NOTE    Eileen Kidd  BEM:[id number] DOB: Aug 13, 1940 DOA: 08/18/2018 PCP: Dettinger, Elige Radon, MD   Chief complaint: Fall  Brief Narrative:   Eileen Kidd is a 78 y.o. 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  She feel on her right buttock and hip area.  She was unable to stand since the injury.  She denies chest pain, shortness of breath, syncope, numbness/tingling.  She did not pass out and she did not hit her head.  She denies abdominal pain, n/v/d and fever.    ED Course:  Pt was evaluated by ED providers and noted to be in severe right hip pain.  Pt was sent to xray which confirmed acute comminuted right hip fracture.  The patient is followed by orthopedist Dr. Lequita Halt with [facility].  His on call colleague Dr. Veda Canning requested admission to [facility] and for patient to remain NPO and will see the patient at [facility].     Assessment & Plan:   Principal Problem:   Closed right hip fracture [facility]) Active Problems:   Osteoporosis   Dyslipidemia   Arthritis   Leukocytosis   Right hip pain   Closed comminuted intertrochanteric fracture of right femur (HCC)   Closed comminuted intertrochanteric fracture of right femur Patient presenting as transfer from any [facility] following mechanical fall at home.  Found to have a closed intertrochanteric fracture right femur.  Underwent right femoral intramedullary nailing by orthopedics on 08/18/2018. --PT evaluation pending; plan to discontinue Foley catheter following evaluation --Weightbearing as tolerated per orthopedics with walker --Norco as needed, Toradol, Robaxin, morphine --On Lovenox for DVT prophylaxis --Case management for SNF coordination  Hypoxia Patient with oxygen saturation this morning  of 87%.  Etiology likely atelectasis.  Lungs are clear to auscultation. --Incentive spirometry --Titrate off of supplemental oxygen, not on home O2.  Leukocytosis Etiology likely reactive from acute femur fracture/fall.  Afebrile. --WBC improving; 19-->12.3 today --Continue to monitor CBC daily  Dyslipidemia: Continue Crestor   DVT prophylaxis: Lovenox Code Status: Full code Family Communication: None Disposition Plan: Likely SNF pending PT evaluation  Consultants:   [facility], Dr. Linna Caprice, MD  Procedures:   Right intra-medullary femoral nailing, 2/22  Antimicrobials:   Ancef preoperatively   Subjective: Patient resting comfortably in bed, nursing staff present.  Pain controlled.  Patient was noted to be slightly hypoxic with SPO2 of 87% on room air this morning and placed on supplemental oxygen.  No other acute issues overnight.  Awaiting PT evaluation.  No other complaints at this time.  Denies headache, no visual changes, no chest pain, no palpitations, no shortness of breath, no nausea/vomiting/diarrhea, no fever/chills/night sweats, no abdominal pain.   Objective: Vitals:   08/19/18 0208 08/19/18 0529 08/19/18 0617 08/19/18 0934  BP: (!) 119/52 (!) 95/40 (!) 108/37 (!) 96/35  Pulse: 73 77 80 69  Resp: 16 16  15   Temp: 98.1 F (36.7 C) 97.8 F (36.6 C)  97.6 F (36.4 C)  TempSrc: Oral Oral  Oral  SpO2: 100% (!) 87%  98%  Weight:      Height:        Intake/Output Summary (Last 24 hours) at 08/19/2018 1012 Last data filed at 08/19/2018 0935 Gross per 24 hour  Intake 2082.36 ml  Output 450 ml  Net 1632.36 ml   Filed Weights   08/18/18 1200  Weight: 43.5 kg    Examination:  General exam: Appears calm and comfortable  Respiratory system: Clear to auscultation. Respiratory effort normal. Cardiovascular system: S1 & S2 heard, RRR. No JVD, murmurs, rubs, gallops or clicks. No pedal edema. Gastrointestinal system: Abdomen is nondistended, soft and  nontender. No organomegaly or masses felt. Normal bowel sounds heard. Central nervous system: Alert and oriented. No focal neurological deficits. Extremities: Symmetric 5 x 5 power. Skin: Right lower extremity operative wounds noted with dressings in place, C/C/I Psychiatry: Judgement and insight appear normal. Mood & affect appropriate.     Data Reviewed: I have personally reviewed following labs and imaging studies  CBC: Recent Labs  Lab 08/18/18 1240 08/19/18 0043  WBC 19.2* 12.3*  NEUTROABS 17.2*  --   HGB 12.7 8.7*  HCT 39.3 28.6*  MCV 94.9 99.0  PLT 309 225   Basic Metabolic Panel: Recent Labs  Lab 08/18/18 1240 08/19/18 0043  NA 142 140  K 3.7 3.9  CL 106 109  CO2 25 22  GLUCOSE 123* 162*  BUN 12 15  CREATININE 0.52 0.72  CALCIUM 8.8* 7.3*   GFR: Estimated Creatinine Clearance: 38.5 mL/min (by C-G formula based on SCr of 0.72 mg/dL). Liver Function Tests: No results for input(s): AST, ALT, ALKPHOS, BILITOT, PROT, ALBUMIN in the last 168 hours. No results for input(s): LIPASE, AMYLASE in the last 168 hours. No results for input(s): AMMONIA in the last 168 hours. Coagulation Profile: Recent Labs  Lab 08/18/18 1240  INR 0.92   Cardiac Enzymes: Recent Labs  Lab 08/18/18 1240  CKTOTAL 86  TROPONINI <0.03   BNP (last 3 results) No results for input(s): PROBNP in the last 8760 hours. HbA1C: No results for input(s): HGBA1C in the last 72 hours. CBG: No results for input(s): GLUCAP in the last 168 hours. Lipid Profile: No results for input(s): CHOL, HDL, LDLCALC, TRIG, CHOLHDL, LDLDIRECT in the last 72 hours. Thyroid Function Tests: No results for input(s): TSH, T4TOTAL, FREET4, T3FREE, THYROIDAB in the last 72 hours. Anemia Panel: No results for input(s): VITAMINB12, FOLATE, FERRITIN, TIBC, IRON, RETICCTPCT in the last 72 hours. Sepsis Labs: No results for input(s): PROCALCITON, LATICACIDVEN in the last 168 hours.  No results found for this or any  previous visit (from the past 240 hour(s)).       Radiology Studies: Dg Chest 1 View  Result Date: 08/18/2018 CLINICAL DATA:  The patient suffered a right hip fracture in a fall today. EXAM: CHEST  1 VIEW COMPARISON:  PA and lateral chest 07/28/2017. FINDINGS: The lungs are emphysematous. No consolidative process, pneumothorax or effusion. Heart size is normal. Aortic atherosclerosis noted. No acute or focal bony abnormality. IMPRESSION: No acute disease. Emphysema. Atherosclerosis. Electronically Signed   By: Drusilla Kanner M.D.   On: 08/18/2018 13:36   Pelvis Portable  Result Date: 08/18/2018 CLINICAL DATA:  Postop IM rod EXAM: PORTABLE PELVIS 1-2 VIEWS COMPARISON:  08/18/2018 FINDINGS: Interval internal fixation across the right femoral intertrochanteric fracture. The lesser trochanter remains mildly displaced. Otherwise near anatomic alignment. No hardware no complicating feature. IMPRESSION: Internal fixation across the right femoral intertrochanteric fracture without visible complicating feature. Electronically Signed   By: Charlett Nose M.D.   On: 08/18/2018 20:46   Dg C-arm 1-60 Min-no Report  Result Date: 08/18/2018 Fluoroscopy was utilized by the requesting physician.  No radiographic interpretation.   Dg Hip Operative Unilat W Or W/o Pelvis Right  Result  Date: 08/18/2018 CLINICAL DATA:  Right femur fracture EXAM: OPERATIVE right HIP (WITH PELVIS IF PERFORMED) 4 VIEWS TECHNIQUE: Fluoroscopic spot image(s) were submitted for interpretation post-operatively. COMPARISON:  08/18/2018 FINDINGS: Four low resolution intraoperative spot views of the right femur. Images demonstrate intramedullary rod and distal screw fixation of right intertrochanteric fracture. IMPRESSION: Intraoperative fluoroscopic assistance provided during surgical fixation of right femur fracture Electronically Signed   By: Jasmine Pang M.D.   On: 08/18/2018 21:49   Dg Hip Unilat With Pelvis 2-3 Views Right  Result  Date: 08/18/2018 CLINICAL DATA:  Fall with right hip pain. EXAM: DG HIP (WITH OR WITHOUT PELVIS) 2-3V RIGHT COMPARISON:  None. FINDINGS: Frontal pelvis shows diffuse bony demineralization. SI joints and symphysis pubis unremarkable. Left hip unremarkable. Comminuted right intertrochanteric femoral neck fracture with varus angulation evident. IMPRESSION: Comminuted right intertrochanteric femoral neck fracture with varus angulation. Electronically Signed   By: Kennith Center M.D.   On: 08/18/2018 13:36   Dg Femur 1v Right  Result Date: 08/18/2018 CLINICAL DATA:  Fall with pain. EXAM: RIGHT FEMUR 1 VIEW COMPARISON:  None. FINDINGS: A single view of the right femur was obtained. There is a comminuted right intertrochanteric femoral neck fracture in the main distal femur fraction appears laterally rotated. No mid or distal right femur fracture. IMPRESSION: Comminuted right intertrochanteric femoral neck fracture. No mid or distal femur fracture evident. Electronically Signed   By: Kennith Center M.D.   On: 08/18/2018 13:37   Dg Femur, Min 2 Views Right  Result Date: 08/18/2018 CLINICAL DATA:  Postop EXAM: RIGHT FEMUR 2 VIEWS COMPARISON:  08/18/2018 FINDINGS: Cutaneous staples over the right hip. Interval intramedullary rod and distal screw fixation of the femur across comminuted intertrochanteric fracture. Displaced lesser trochanteric fracture fragment. Gas in the soft tissues consistent with recent surgery. Vascular calcifications. IMPRESSION: Interval intramedullary rod and screw fixation of the right femur for comminuted intertrochanteric femur fracture Electronically Signed   By: Jasmine Pang M.D.   On: 08/18/2018 20:19        Scheduled Meds: . docusate sodium  100 mg Oral BID  . enoxaparin (LOVENOX) injection  40 mg Subcutaneous Q24H  . feeding supplement (ENSURE ENLIVE)  237 mL Oral BID BM  . ketorolac  7.5 mg Intravenous Q6H  . senna  1 tablet Oral BID   Continuous Infusions: . sodium  chloride    . famotidine (PEPCID) IV 20 mg (08/19/18 0820)  . methocarbamol (ROBAXIN) IV       LOS: 1 day    Time spent: 28 minutes    Alvira Philips [place], DO [facility] Pager [phone]  If 7PM-7AM, please contact night-coverage [email or web address] Password [email or web address] 08/19/2018, 10:12 AM

## 2018-08-19 NOTE — Progress Notes (Signed)
   Subjective: 1 Day Post-Op Procedure(s) (LRB): INTRAMEDULLARY (IM) FEMORAL NAILING (Right) Patient reports pain as mild.   Patient seen in rounds for Dr. Linna Caprice. Patient is well, and has had no acute complaints or problems. Reports minimal right hip pain. Denies SOB and chest pain. No issues overnight. Unable to get up with therapy yesterday due to getting to floor late.    Objective: Vital signs in last 24 hours: Temp:  [97.8 F (36.6 C)-98.8 F (37.1 C)] 97.8 F (36.6 C) (02/23 0529) Pulse Rate:  [73-89] 80 (02/23 0617) Resp:  [15-21] 16 (02/23 0529) BP: (95-146)/(37-104) 108/37 (02/23 0617) SpO2:  [77 %-100 %] 87 % (02/23 0529) Weight:  [43.5 kg] 43.5 kg (02/22 1200)  Intake/Output from previous day:  Intake/Output Summary (Last 24 hours) at 08/19/2018 0854 Last data filed at 08/19/2018 0524 Gross per 24 hour  Intake 2022.36 ml  Output 300 ml  Net 1722.36 ml   Labs: Recent Labs    08/18/18 1240 08/19/18 0043  HGB 12.7 8.7*   Recent Labs    08/18/18 1240 08/19/18 0043  WBC 19.2* 12.3*  RBC 4.14 2.89*  HCT 39.3 28.6*  PLT 309 225   Recent Labs    08/18/18 1240 08/19/18 0043  NA 142 140  K 3.7 3.9  CL 106 109  CO2 25 22  BUN 12 15  CREATININE 0.52 0.72  GLUCOSE 123* 162*  CALCIUM 8.8* 7.3*   Recent Labs    08/18/18 1240  INR 0.92    EXAM General - Patient is Alert and Oriented Extremity - Neurologically intact Intact pulses distally Dorsiflexion/Plantar flexion intact Compartment soft Dressing/Incision - clean, dry Motor Function - intact, moving foot and toes well on exam.   Past Medical History:  Diagnosis Date  . Arthritis    left knee  . Endometriosis   . History of fractured kneecap    LEFT  . Hyperlipidemia   . Osteoporosis     Assessment/Plan: 1 Day Post-Op Procedure(s) (LRB): INTRAMEDULLARY (IM) FEMORAL NAILING (Right) Principal Problem:   Closed right hip fracture (HCC) Active Problems:   Osteoporosis  Dyslipidemia   Arthritis   Leukocytosis   Right hip pain   Closed comminuted intertrochanteric fracture of right femur (HCC)  Estimated body mass index is 17.56 kg/m as calculated from the following:   Height as of this encounter: 5\' 2"  (1.575 m).   Weight as of this encounter: 43.5 kg. Advance diet Up with therapy  DVT Prophylaxis - Lovenox Weight-Bearing as tolerated   Will have her get up with therapy today. Probable DC to SNF in the next few days pending progress. Will remove foley after first session of therapy.   Dimitri Ped, PA-C Orthopaedic Surgery 08/19/2018, 8:54 AM

## 2018-08-20 ENCOUNTER — Encounter (HOSPITAL_COMMUNITY): Payer: Self-pay | Admitting: Orthopedic Surgery

## 2018-08-20 DIAGNOSIS — D62 Acute posthemorrhagic anemia: Secondary | ICD-10-CM

## 2018-08-20 LAB — BASIC METABOLIC PANEL
Anion gap: 5 (ref 5–15)
BUN: 20 mg/dL (ref 8–23)
CO2: 27 mmol/L (ref 22–32)
Calcium: 7.8 mg/dL — ABNORMAL LOW (ref 8.9–10.3)
Chloride: 107 mmol/L (ref 98–111)
Creatinine, Ser: 0.59 mg/dL (ref 0.44–1.00)
GFR calc Af Amer: >60 mL/min (ref >60)
GFR calc non Af Amer: >60 mL/min (ref >60)
GLUCOSE: 112 mg/dL — AB (ref 70–99)
Potassium: 3.5 mmol/L (ref 3.5–5.1)
Sodium: 139 mmol/L (ref 135–145)

## 2018-08-20 LAB — CBC
HCT: 21.2 % — ABNORMAL LOW (ref 36.0–46.0)
Hemoglobin: 6.7 g/dL — CL (ref 12.0–15.0)
MCH: 31 pg (ref 26.0–34.0)
MCHC: 31.6 g/dL (ref 30.0–36.0)
MCV: 98.1 fL (ref 80.0–100.0)
PLATELETS: 183 10*3/uL (ref 150–400)
RBC: 2.16 MIL/uL — ABNORMAL LOW (ref 3.87–5.11)
RDW: 14.2 % (ref 11.5–15.5)
WBC: 9.6 10*3/uL (ref 4.0–10.5)
nRBC: 0 % (ref 0.0–0.2)

## 2018-08-20 LAB — URINALYSIS, ROUTINE W REFLEX MICROSCOPIC
Bilirubin Urine: NEGATIVE
Glucose, UA: NEGATIVE mg/dL
Hgb urine dipstick: NEGATIVE
Ketones, ur: 5 mg/dL — AB
Nitrite: NEGATIVE
Protein, ur: NEGATIVE mg/dL
Specific Gravity, Urine: 1.018 (ref 1.005–1.030)
pH: 5 (ref 5.0–8.0)

## 2018-08-20 LAB — PREPARE RBC (CROSSMATCH)

## 2018-08-20 LAB — TYPE AND SCREEN
ABO/RH(D): O POS
Antibody Screen: NEGATIVE

## 2018-08-20 MED ORDER — MELATONIN 3 MG PO TABS
3.0000 mg | ORAL_TABLET | Freq: Every day | ORAL | Status: DC
  Administered 2018-08-20 – 2018-08-21 (×2): 3 mg via ORAL
  Filled 2018-08-20 (×2): qty 1

## 2018-08-20 MED ORDER — LORAZEPAM 2 MG/ML IJ SOLN: 0.5000 mg | Freq: Four times a day (QID) | INTRAMUSCULAR | Status: DC | PRN

## 2018-08-20 MED ORDER — SODIUM CHLORIDE 0.9% IV SOLUTION
Freq: Once | INTRAVENOUS | Status: AC
  Administered 2018-08-20: 09:00:00 via INTRAVENOUS

## 2018-08-20 NOTE — Progress Notes (Signed)
Patient very confused, agitated, and making several attempts to get OOB unassisted. She removed dressing from distal incision. Staples intact. Urine specimen sent for u/a and microscopy. Results unremarkable. Bed alarm in place and staff sitting near room. Will continue to monitor behavior.

## 2018-08-20 NOTE — Progress Notes (Addendum)
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  She feel on her right buttock and hip area.  She was unable to stand since the injury.  She denies chest pain, shortness of breath, syncope, numbness/tingling.  She did not pass out and she did not hit her head.  She denies abdominal pain, n/v/d and fever.    ED Course:  Pt was evaluated by ED providers and noted to be in severe right hip pain.  Pt was sent to xray which confirmed acute comminuted right hip fracture.  The patient is followed by orthopedist Dr. Lequita Halt with [facility].  His on call colleague Dr. Veda Canning requested admission to [facility] and for patient to remain NPO and will see the patient at [facility].     Assessment & Plan:   Principal Problem:   Closed right hip fracture (HCC) Active Problems:   Osteoporosis   Dyslipidemia   Arthritis   Leukocytosis   Right hip pain   Closed comminuted intertrochanteric fracture of right femur (HCC)   Closed comminuted intertrochanteric fracture of right femur Patient presenting as transfer from [facility] following mechanical fall at home.  Found to have a closed intertrochanteric fracture right femur.  Underwent right femoral intramedullary nailing by orthopedics on 08/18/2018. --Weightbearing as tolerated per orthopedics with walker --Norco as needed, Toradol, Robaxin, morphine --On Lovenox for DVT prophylaxis --PT following, recommend SNF --Case management following, SNF referral sent for [facility] [place]  Confusion/hospital-acquired delirium Overnight, patient became  progressively confused even removing 1 of her leg dressings.  A urinalysis was performed that was unremarkable.  She is afebrile no other infectious etiology elucidated.  Etiology likely hospital-acquired delirium. --Recommend maintain lights on during the day --We will schedule melatonin qhs --ativan 0.5mg  IV q6h prn agitation  Acute postoperative blood loss anemia Hemoglobin notably trended down from 12.7--> 8.7-->6.6 today.  Hemodynamically stable, no signs of active bleeding. --transfuse 2 units PRBCs today --continue to monitor closely  Hypoxia, suspect atelectasis following surgical intervention Patient with oxygen saturation this morning of 87%.  Etiology likely atelectasis.  Lungs are clear to auscultation. --Titrated off of oxygen, now on room air with adequate saturation --Incentive spirometry  Leukocytosis Etiology likely reactive from acute femur fracture/fall.  Afebrile. --WBC improving; 19-->12.3--> labs pending this a.m. --Continue to monitor CBC daily  Dyslipidemia: Continue Crestor   DVT prophylaxis: Lovenox Code Status: Full code Family Communication: None Disposition Plan: SNF, CM for coordination  Consultants:   [facility], Dr. Linna Caprice, MD  Procedures:   Right intra-medullary femoral nailing, 2/22  Antimicrobials:   Ancef preoperatively   Subjective: Patient resting comfortably in bedside chair, currently having labs drawn.  Overnight she became increasingly confused and subsequently remove 1 of her surgical dressings.  Urinalysis was performed that was unremarkable, she is remained afebrile, oxygenating well on room air, no infectious etiology elucidated.  Etiology likely hospital-acquired delirium.  Currently back to her normal mental state. Continues to work with PT, pending SNF placement.  No other complaints at this time.  Denies headache, no visual changes, no chest pain, no palpitations, no shortness of  breath, no nausea/vomiting/diarrhea, no  fever/chills/night sweats, no abdominal pain.   Objective: Vitals:   08/19/18 0529 08/19/18 0617 08/19/18 0934 08/19/18 1340  BP: (!) 95/40 (!) 108/37 (!) 96/35 (!) 100/40  Pulse: 77 80 69 81  Resp: 16  15 15   Temp: 97.8 F (36.6 C)  97.6 F (36.4 C) 97.9 F (36.6 C)  TempSrc: Oral  Oral Oral  SpO2: (!) 87%  98% 100%  Weight:      Height:        Intake/Output Summary (Last 24 hours) at 08/20/2018 0812 Last data filed at 08/20/2018 0759 Gross per 24 hour  Intake 400 ml  Output 800 ml  Net -400 ml   Filed Weights   08/18/18 1200  Weight: 43.5 kg    Examination:  General exam: Appears calm and comfortable  Respiratory system: Clear to auscultation. Respiratory effort normal. Cardiovascular system: S1 & S2 heard, RRR. No JVD, murmurs, rubs, gallops or clicks. No pedal edema. Gastrointestinal system: Abdomen is nondistended, soft and nontender. No organomegaly or masses felt. Normal bowel sounds heard. Central nervous system: Alert and oriented. No focal neurological deficits. Extremities: Symmetric 5 x 5 power. Skin: Right lower extremity operative wounds noted with dressings in place, C/D/I Psychiatry: Judgement and insight appear normal. Mood & affect appropriate.     Data Reviewed: I have personally reviewed following labs and imaging studies  CBC: Recent Labs  Lab 08/18/18 1240 08/19/18 0043  WBC 19.2* 12.3*  NEUTROABS 17.2*  --   HGB 12.7 8.7*  HCT 39.3 28.6*  MCV 94.9 99.0  PLT 309 225   Basic Metabolic Panel: Recent Labs  Lab 08/18/18 1240 08/19/18 0043  NA 142 140  K 3.7 3.9  CL 106 109  CO2 25 22  GLUCOSE 123* 162*  BUN 12 15  CREATININE 0.52 0.72  CALCIUM 8.8* 7.3*   GFR: Estimated Creatinine Clearance: 38.5 mL/min (by C-G formula based on SCr of 0.72 mg/dL). Liver Function Tests: No results for input(s): AST, ALT, ALKPHOS, BILITOT, PROT, ALBUMIN in the last 168 hours. No results for input(s): LIPASE, AMYLASE in the last 168 hours. No  results for input(s): AMMONIA in the last 168 hours. Coagulation Profile: Recent Labs  Lab 08/18/18 1240  INR 0.92   Cardiac Enzymes: Recent Labs  Lab 08/18/18 1240  CKTOTAL 86  TROPONINI <0.03   BNP (last 3 results) No results for input(s): PROBNP in the last 8760 hours. HbA1C: No results for input(s): HGBA1C in the last 72 hours. CBG: No results for input(s): GLUCAP in the last 168 hours. Lipid Profile: No results for input(s): CHOL, HDL, LDLCALC, TRIG, CHOLHDL, LDLDIRECT in the last 72 hours. Thyroid Function Tests: No results for input(s): TSH, T4TOTAL, FREET4, T3FREE, THYROIDAB in the last 72 hours. Anemia Panel: No results for input(s): VITAMINB12, FOLATE, FERRITIN, TIBC, IRON, RETICCTPCT in the last 72 hours. Sepsis Labs: No results for input(s): PROCALCITON, LATICACIDVEN in the last 168 hours.  No results found for this or any previous visit (from the past 240 hour(s)).       Radiology Studies: Dg Chest 1 View  Result Date: 08/18/2018 CLINICAL DATA:  The patient suffered a right hip fracture in a fall today. EXAM: CHEST  1 VIEW COMPARISON:  PA and lateral chest 07/28/2017. FINDINGS: The lungs are emphysematous. No consolidative process, pneumothorax or effusion. Heart size is normal. Aortic atherosclerosis noted. No acute or focal bony abnormality. IMPRESSION: No acute disease. Emphysema. Atherosclerosis. Electronically Signed   By: Drusilla Kanner  M.D.   On: 08/18/2018 13:36   Pelvis Portable  Result Date: 08/18/2018 CLINICAL DATA:  Postop IM rod EXAM: PORTABLE PELVIS 1-2 VIEWS COMPARISON:  08/18/2018 FINDINGS: Interval internal fixation across the right femoral intertrochanteric fracture. The lesser trochanter remains mildly displaced. Otherwise near anatomic alignment. No hardware no complicating feature. IMPRESSION: Internal fixation across the right femoral intertrochanteric fracture without visible complicating feature. Electronically Signed   By: Charlett Nose  M.D.   On: 08/18/2018 20:46   Dg C-arm 1-60 Min-no Report  Result Date: 08/18/2018 Fluoroscopy was utilized by the requesting physician.  No radiographic interpretation.   Dg Hip Operative Unilat W Or W/o Pelvis Right  Result Date: 08/18/2018 CLINICAL DATA:  Right femur fracture EXAM: OPERATIVE right HIP (WITH PELVIS IF PERFORMED) 4 VIEWS TECHNIQUE: Fluoroscopic spot image(s) were submitted for interpretation post-operatively. COMPARISON:  08/18/2018 FINDINGS: Four low resolution intraoperative spot views of the right femur. Images demonstrate intramedullary rod and distal screw fixation of right intertrochanteric fracture. IMPRESSION: Intraoperative fluoroscopic assistance provided during surgical fixation of right femur fracture Electronically Signed   By: Jasmine Pang M.D.   On: 08/18/2018 21:49   Dg Hip Unilat With Pelvis 2-3 Views Right  Result Date: 08/18/2018 CLINICAL DATA:  Fall with right hip pain. EXAM: DG HIP (WITH OR WITHOUT PELVIS) 2-3V RIGHT COMPARISON:  None. FINDINGS: Frontal pelvis shows diffuse bony demineralization. SI joints and symphysis pubis unremarkable. Left hip unremarkable. Comminuted right intertrochanteric femoral neck fracture with varus angulation evident. IMPRESSION: Comminuted right intertrochanteric femoral neck fracture with varus angulation. Electronically Signed   By: Kennith Center M.D.   On: 08/18/2018 13:36   Dg Femur 1v Right  Result Date: 08/18/2018 CLINICAL DATA:  Fall with pain. EXAM: RIGHT FEMUR 1 VIEW COMPARISON:  None. FINDINGS: A single view of the right femur was obtained. There is a comminuted right intertrochanteric femoral neck fracture in the main distal femur fraction appears laterally rotated. No mid or distal right femur fracture. IMPRESSION: Comminuted right intertrochanteric femoral neck fracture. No mid or distal femur fracture evident. Electronically Signed   By: Kennith Center M.D.   On: 08/18/2018 13:37   Dg Femur, Min 2 Views  Right  Result Date: 08/18/2018 CLINICAL DATA:  Postop EXAM: RIGHT FEMUR 2 VIEWS COMPARISON:  08/18/2018 FINDINGS: Cutaneous staples over the right hip. Interval intramedullary rod and distal screw fixation of the femur across comminuted intertrochanteric fracture. Displaced lesser trochanteric fracture fragment. Gas in the soft tissues consistent with recent surgery. Vascular calcifications. IMPRESSION: Interval intramedullary rod and screw fixation of the right femur for comminuted intertrochanteric femur fracture Electronically Signed   By: Jasmine Pang M.D.   On: 08/18/2018 20:19        Scheduled Meds: . docusate sodium  100 mg Oral BID  . enoxaparin (LOVENOX) injection  30 mg Subcutaneous Q24H  . feeding supplement (ENSURE ENLIVE)  237 mL Oral BID BM  . rosuvastatin  20 mg Oral Daily  . senna  1 tablet Oral BID   Continuous Infusions: . methocarbamol (ROBAXIN) IV       LOS: 2 days    Time spent: 26 minutes    Alvira Philips [place], DO [facility] Pager [phone]  If 7PM-7AM, please contact night-coverage [email or web address] Password [email or web address] 08/20/2018, 8:12 AM

## 2018-08-20 NOTE — Progress Notes (Signed)
    Subjective:  Patient reports pain as mild to moderate.  Denies N/V/CP/SOB.   Objective:   VITALS:   Vitals:   08/19/18 0934 08/19/18 1340 08/20/18 0939 08/20/18 1020  BP: (!) 96/35 (!) 100/40 (!) 104/44 (!) 113/33  Pulse: 69 81 87 83  Resp: 15 15 16 16   Temp: 97.6 F (36.4 C) 97.9 F (36.6 C) 98 F (36.7 C) 97.6 F (36.4 C)  TempSrc: Oral Oral Oral Axillary  SpO2: 98% 100% 98% 100%  Weight:      Height:        NAD ABD soft Sensation intact distally Intact pulses distally Dorsiflexion/Plantar flexion intact Incision: dressing C/D/I Compartment soft   Lab Results  Component Value Date   WBC 9.6 08/20/2018   HGB 6.7 (LL) 08/20/2018   HCT 21.2 (L) 08/20/2018   MCV 98.1 08/20/2018   PLT 183 08/20/2018   BMET    Component Value Date/Time   NA 139 08/20/2018 0747   NA 143 02/15/2018 0831   K 3.5 08/20/2018 0747   CL 107 08/20/2018 0747   CO2 27 08/20/2018 0747   GLUCOSE 112 (H) 08/20/2018 0747   BUN 20 08/20/2018 0747   BUN 10 02/15/2018 0831   CREATININE 0.59 08/20/2018 0747   CALCIUM 7.8 (L) 08/20/2018 0747   GFRNONAA >60 08/20/2018 0747   GFRAA >60 08/20/2018 0747     Assessment/Plan: 2 Days Post-Op   Principal Problem:   Closed right hip fracture (HCC) Active Problems:   Osteoporosis   Dyslipidemia   Arthritis   Leukocytosis   Right hip pain   Closed comminuted intertrochanteric fracture of right femur (HCC)   WBAT with walker DVT ppx: Lovenox, SCDs, TEDS PO pain control ABLA: hgb 6.7, 2 units PRBCs ordered by primary team PT/OT Dispo: D/C planning   Iline Oven Quetzalli Clos 08/20/2018, 12:54 PM   Samson Frederic, MD Cell: [phone] [facility] is now [facility]  [place] [address], [place], [place] [address] Phone: [phone] [email or web address] Facebook  [employer]

## 2018-08-20 NOTE — Progress Notes (Signed)
Patient continues to be very confused and agitated and has made several attempts to get OOB. I spoke with Eileen Kidd her daughter this morning and updated her on her mother's mental status change. She stated she noticed a change on Sunday while she was visiting. Attempted to let daughter talk with patient but she became more agitated and stated she does not want to talk with anyone. Patient has removed a second dressing from her right thigh. Will continue safety measures

## 2018-08-20 NOTE — Progress Notes (Signed)
CRITICAL VALUE ALERT  Critical Value:  Hgb 6.6  Date & Time Notified: 08/20/18 6720  Provider Notified: Dr [place]  Orders Received/Actions taken: Dr [place] called at 0827, informed me he will order 2 units PRBC

## 2018-08-21 LAB — CBC
HCT: 28.9 % — ABNORMAL LOW (ref 36.0–46.0)
Hemoglobin: 9.5 g/dL — ABNORMAL LOW (ref 12.0–15.0)
MCH: 29.9 pg (ref 26.0–34.0)
MCHC: 32.9 g/dL (ref 30.0–36.0)
MCV: 90.9 fL (ref 80.0–100.0)
Platelets: 150 10*3/uL (ref 150–400)
RBC: 3.18 MIL/uL — ABNORMAL LOW (ref 3.87–5.11)
RDW: 15.8 % — ABNORMAL HIGH (ref 11.5–15.5)
WBC: 8.3 10*3/uL (ref 4.0–10.5)
nRBC: 0.4 % — ABNORMAL HIGH (ref 0.0–0.2)

## 2018-08-21 LAB — BASIC METABOLIC PANEL
ANION GAP: 6 (ref 5–15)
BUN: 16 mg/dL (ref 8–23)
CO2: 25 mmol/L (ref 22–32)
Calcium: 7.9 mg/dL — ABNORMAL LOW (ref 8.9–10.3)
Chloride: 109 mmol/L (ref 98–111)
Creatinine, Ser: 0.48 mg/dL (ref 0.44–1.00)
GFR calc Af Amer: >60 mL/min (ref >60)
GFR calc non Af Amer: >60 mL/min (ref >60)
Glucose, Bld: 122 mg/dL — ABNORMAL HIGH (ref 70–99)
Potassium: 3.4 mmol/L — ABNORMAL LOW (ref 3.5–5.1)
SODIUM: 140 mmol/L (ref 135–145)

## 2018-08-21 LAB — BPAM RBC
Blood Product Expiration Date: 202003212359
Blood Product Expiration Date: 202003212359
ISSUE DATE / TIME: 202002240950
ISSUE DATE / TIME: 202002242039
Unit Type and Rh: 5100
Unit Type and Rh: 5100

## 2018-08-21 LAB — TYPE AND SCREEN
ABO/RH(D): O POS
Antibody Screen: NEGATIVE
Unit division: 0
Unit division: 0

## 2018-08-21 LAB — URINE CULTURE: Culture: NO GROWTH

## 2018-08-21 LAB — HEMOGLOBIN AND HEMATOCRIT, BLOOD
HCT: 30.4 % — ABNORMAL LOW (ref 36.0–46.0)
HEMOGLOBIN: 10 g/dL — AB (ref 12.0–15.0)

## 2018-08-21 MED ORDER — POTASSIUM CHLORIDE CRYS ER 20 MEQ PO TBCR
40.0000 meq | EXTENDED_RELEASE_TABLET | Freq: Once | ORAL | Status: AC
  Administered 2018-08-21: 40 meq via ORAL
  Filled 2018-08-21: qty 2

## 2018-08-21 NOTE — Progress Notes (Signed)
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  Pt s/p right femoral IM nail 08/18/18.    PT Comments    Pt assisted to [facility] and then ambulated short distance in hallway.  Ambulation limited by generalized weakness, R hip pain and fatigue.  Continue to recommend SNF upon d/c.   Follow Up Recommendations  SNF     Equipment Recommendations  Rolling walker with 5" wheels    Recommendations for Other Services       Precautions / Restrictions Precautions Precautions: Fall Restrictions Other Position/Activity Restrictions: WBAT    Mobility  Bed Mobility               General bed mobility comments: pt sitting EOB with NT on arrival  Transfers Overall transfer level: Needs assistance Equipment used: Rolling walker (2 wheeled) Transfers: Sit to/from [employer] Sit to Stand: Min assist Stand pivot transfers: Min assist       General transfer comment: assist to rise and steady as well as control descent, pt requested BSC prior to ambulation (reports frequent urination)  Ambulation/Gait Ambulation/Gait assistance: Min assist Gait Distance (Feet): 28 Feet Assistive device: Rolling walker (2 wheeled) Gait Pattern/deviations: Step-to pattern;Decreased stance time - right;Antalgic     General Gait Details: verbal cues for sequence, RW positioning, step length; pt fatigued quickly   Stairs             Wheelchair Mobility    Modified Rankin (Stroke Patients Only)       Balance                                            Cognition Arousal/Alertness:  Awake/alert Behavior During Therapy: WFL for tasks assessed/performed Overall Cognitive Status: Within Functional Limits for tasks assessed                                        Exercises      General Comments        Pertinent Vitals/Pain Pain Assessment: 0-10 Pain Score: 5  Pain Location: R hip/thigh Pain Descriptors / Indicators: Aching;Sore;Tender Pain Intervention(s): Premedicated before session;Limited activity within patient's tolerance;Monitored during session;Repositioned    Home Living                      Prior Function            PT Goals (current goals can now be found in the care plan section) Progress towards PT goals: Progressing toward goals    Frequency    Min 3X/week      PT Plan Current plan remains appropriate    Co-evaluation              AM-PAC PT "6 Clicks" Mobility   Outcome Measure  Help needed turning from your back to your side while in a flat bed without using bedrails?: A Little Help needed moving from lying on your back to sitting on the  side of a flat bed without using bedrails?: A Little Help needed moving to and from a bed to a chair (including a wheelchair)?: A Little Help needed standing up from a chair using your arms (e.g., wheelchair or bedside chair)?: A Little Help needed to walk in hospital room?: A Little Help needed climbing 3-5 steps with a railing? : A Lot 6 Click Score: 17    End of Session Equipment Utilized During Treatment: Gait belt Activity Tolerance: Patient tolerated treatment well Patient left: in chair;with call bell/phone within reach;with chair alarm set Nurse Communication: Mobility status PT Visit Diagnosis: Other abnormalities of gait and mobility (R26.89)     Time: 2993-7169 PT Time Calculation (min) (ACUTE ONLY): 20 min  Charges:  $Gait Training: 8-22 mins                     [name], PT, DPT Acute Rehabilitation Services Office: [phone] Pager:  [phone]  Sarajane Jews 08/21/2018, 2:14 PM

## 2018-08-21 NOTE — Plan of Care (Signed)
  Problem: Education: Goal: Knowledge of General Education information will improve Description Including pain rating scale, medication(s)/side effects and non-pharmacologic comfort measures Outcome: Progressing   Problem: Health Behavior/Discharge Planning: Goal: Ability to manage health-related needs will improve Outcome: Progressing   Problem: Clinical Measurements: Goal: Ability to maintain clinical measurements within normal limits will improve Outcome: Progressing Goal: Will remain free from infection Outcome: Progressing Goal: Respiratory complications will improve Outcome: Progressing Goal: Cardiovascular complication will be avoided Outcome: Progressing   Problem: Activity: Goal: Risk for activity intolerance will decrease Outcome: Progressing   Problem: Coping: Goal: Level of anxiety will decrease Outcome: Progressing   Problem: Pain Managment: Goal: General experience of comfort will improve Outcome: Progressing   Problem: Safety: Goal: Ability to remain free from injury will improve Outcome: Progressing   Problem: Skin Integrity: Goal: Risk for impaired skin integrity will decrease Outcome: Progressing   Problem: Education: Goal: Verbalization of understanding the information provided (i.e., activity precautions, restrictions, etc) will improve Outcome: Progressing Goal: Individualized Educational Video(s) Outcome: Progressing   Problem: Activity: Goal: Ability to ambulate and perform ADLs will improve Outcome: Progressing   Problem: Clinical Measurements: Goal: Postoperative complications will be avoided or minimized Outcome: Progressing   Problem: Self-Concept: Goal: Ability to maintain and perform role responsibilities to the fullest extent possible will improve Outcome: Progressing

## 2018-08-21 NOTE — Progress Notes (Signed)
PROGRESS NOTE    Eileen Kidd  PTW:[id number] DOB: 09-Jan-1941 DOA: 08/18/2018 PCP: Dettinger, Elige Radon, MD   Chief complaint: Fall  Brief Narrative:   Eileen Kidd is a 78 y.o. 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  She feel on her right buttock and hip area.  She was unable to stand since the injury.  She denies chest pain, shortness of breath, syncope, numbness/tingling.  She did not pass out and she did not hit her head.  She denies abdominal pain, n/v/d and fever.    ED Course:  Pt was evaluated by ED providers and noted to be in severe right hip pain.  Pt was sent to xray which confirmed acute comminuted right hip fracture.  The patient is followed by orthopedist Dr. Lequita Halt with [facility].  His on call colleague Dr. Veda Canning requested admission to [facility] and for patient to remain NPO and will see the patient at [facility].     Assessment & Plan:   Principal Problem:   Closed right hip fracture (HCC) Active Problems:   Osteoporosis   Dyslipidemia   Arthritis   Leukocytosis   Right hip pain   Closed comminuted intertrochanteric fracture of right femur (HCC)   Closed comminuted intertrochanteric fracture of right femur Patient presenting as transfer from [facility] following mechanical fall at home.  Found to have a closed intertrochanteric fracture right femur.  Underwent right femoral intramedullary nailing by orthopedics on 08/18/2018. --Weightbearing as tolerated per orthopedics with walker --Norco as needed, Toradol, Robaxin, morphine --On Lovenox for DVT prophylaxis --PT following, recommend SNF --Case management following, SNF referrals pending  Confusion/hospital-acquired delirium Postoperatively, patient became progressively  confused even removing one of her leg dressings.  A urinalysis was performed that was unremarkable.  She is afebrile no other infectious etiology elucidated.  Etiology likely hospital-acquired delirium coupled with her acute blood loss anemia as below. --Recommend maintain lights on during the day --Continue schedule melatonin qhs --ativan 0.5mg  IV q6h prn agitation --Fall risk precautions  Acute postoperative blood loss anemia Hemoglobin notably trended down from 12.7--> 8.7-->6.7 on postop day #1.  Hemodynamically stable, no signs of active bleeding. --transfused 2 units PRBCs 2/24 --Hemoglobin up to 9.5 --Repeat hemoglobin this afternoon to ensure stabilization --continue to monitor closely  Hypoxia, suspect atelectasis following surgical intervention: Resolved Patient with oxygen saturation this morning of 87%.  Etiology likely atelectasis.  Lungs are clear to auscultation. --Titrated off of oxygen, now on room air with adequate saturation --Incentive spirometry  Leukocytosis Etiology likely reactive from acute femur fracture/fall.  Afebrile. --WBC improving; 19-->12.3-->8.3  --Continue to monitor CBC daily  Dyslipidemia: Continue Crestor   DVT prophylaxis: Lovenox Code Status: Full code Family Communication: None Disposition Plan: SNF, CM for coordination  Consultants:   [facility], Dr. Linna Caprice, MD  Procedures:   Right intra-medullary femoral nailing, 2/22  Infusion 2 units PRBCs on 08/20/2018  Antimicrobials:   Ancef preoperatively   Subjective: Patient resting comfortably in bed, no family present, nursing present in room during exam.   Continues with fluctuating delusions.  At time of exam, alert and oriented and appropriate.  Continues with some right-sided knee/hip pain, but improved.  Hemoglobin up to 9.5 following blood transfusion yesterday.  Patient reports better sleep overnight.  No other complaints at  this time.  Denies headache, no visual changes, no  fever/chills/night sweats, no chest pain, no palpitations, no shortness of breath, no abdominal discomfort.  No other acute events overnight per nursing staff.  Objective: Vitals:   08/20/18 2040 08/20/18 2108 08/20/18 2343 08/21/18 0516  BP: (!) 125/56 (!) 109/45 (!) 124/48 (!) 120/37  Pulse: 79 80 76 72  Resp: 17 17 16 16   Temp: 98.5 F (36.9 C) 98.4 F (36.9 C) 98.4 F (36.9 C) 98.4 F (36.9 C)  TempSrc: Oral Oral Oral Oral  SpO2: 98% 100% 95% 95%  Weight:      Height:        Intake/Output Summary (Last 24 hours) at 08/21/2018 1054 Last data filed at 08/21/2018 0751 Gross per 24 hour  Intake 1474 ml  Output 1300 ml  Net 174 ml   Filed Weights   08/18/18 1200  Weight: 43.5 kg    Examination:  General exam: Appears calm and comfortable  Respiratory system: Clear to auscultation. Respiratory effort normal. Cardiovascular system: S1 & S2 heard, RRR. No JVD, murmurs, rubs, gallops or clicks. No pedal edema. Gastrointestinal system: Abdomen is nondistended, soft and nontender. No organomegaly or masses felt. Normal bowel sounds heard. Central nervous system: Alert and oriented. No focal neurological deficits. Extremities: Symmetric 5 x 5 power. Skin: Right lower extremity operative wounds noted with dressings in place, ecchymosis, dressings C/D/I Psychiatry: Judgement and insight appear normal. Mood & affect appropriate.     Data Reviewed: I have personally reviewed following labs and imaging studies  CBC: Recent Labs  Lab 08/18/18 1240 08/19/18 0043 08/20/18 0747 08/21/18 0332  WBC 19.2* 12.3* 9.6 8.3  NEUTROABS 17.2*  --   --   --   HGB 12.7 8.7* 6.7* 9.5*  HCT 39.3 28.6* 21.2* 28.9*  MCV 94.9 99.0 98.1 90.9  PLT 309 225 183 150   Basic Metabolic Panel: Recent Labs  Lab 08/18/18 1240 08/19/18 0043 08/20/18 0747 08/21/18 0332  NA 142 140 139 140  K 3.7 3.9 3.5 3.4*  CL 106 109 107 109  CO2 25 22 27 25   GLUCOSE 123* 162* 112* 122*  BUN 12 15 20 16     CREATININE 0.52 0.72 0.59 0.48  CALCIUM 8.8* 7.3* 7.8* 7.9*   GFR: Estimated Creatinine Clearance: 38.5 mL/min (by C-G formula based on SCr of 0.48 mg/dL). Liver Function Tests: No results for input(s): AST, ALT, ALKPHOS, BILITOT, PROT, ALBUMIN in the last 168 hours. No results for input(s): LIPASE, AMYLASE in the last 168 hours. No results for input(s): AMMONIA in the last 168 hours. Coagulation Profile: Recent Labs  Lab 08/18/18 1240  INR 0.92   Cardiac Enzymes: Recent Labs  Lab 08/18/18 1240  CKTOTAL 86  TROPONINI <0.03   BNP (last 3 results) No results for input(s): PROBNP in the last 8760 hours. HbA1C: No results for input(s): HGBA1C in the last 72 hours. CBG: No results for input(s): GLUCAP in the last 168 hours. Lipid Profile: No results for input(s): CHOL, HDL, LDLCALC, TRIG, CHOLHDL, LDLDIRECT in the last 72 hours. Thyroid Function Tests: No results for input(s): TSH, T4TOTAL, FREET4, T3FREE, THYROIDAB in the last 72 hours. Anemia Panel: No results for input(s): VITAMINB12, FOLATE, FERRITIN, TIBC, IRON, RETICCTPCT in the last 72 hours. Sepsis Labs: No results for input(s): PROCALCITON, LATICACIDVEN in the last 168 hours.  Recent Results (from the past 240 hour(s))  Urine culture     Status: None   Collection Time: 08/20/18 12:09 AM  Result Value Ref  Range Status   Specimen Description   Final    URINE, CATHETERIZED Performed at [facility], [address] [place], [place] [address]    Special Requests   Final    NONE Performed at [facility], [address] [place], [place] [address]    Culture   Final    NO GROWTH Performed at [facility], [address] [place], [place] [address]    Report Status 08/21/2018 FINAL  Final         Radiology Studies: No results found.      Scheduled Meds: . docusate sodium  100 mg Oral BID  . enoxaparin (LOVENOX) injection  30 mg Subcutaneous Q24H  . feeding supplement  (ENSURE ENLIVE)  237 mL Oral BID BM  . Melatonin  3 mg Oral QHS  . rosuvastatin  20 mg Oral Daily  . senna  1 tablet Oral BID   Continuous Infusions: . methocarbamol (ROBAXIN) IV       LOS: 3 days    Time spent: 28 minutes    Eileen Philips [place], DO [facility] Pager [phone]  If 7PM-7AM, please contact night-coverage [email or web address] Password [email or web address] 08/21/2018, 10:54 AM

## 2018-08-21 NOTE — Plan of Care (Signed)
Plan of care reviewed and discussed.  Pt. Unable to process and retain new information limited by patient's diminished cognitive ability.

## 2018-08-21 NOTE — Progress Notes (Signed)
Initial Nutrition Assessment  DOCUMENTATION CODES:   Underweight  INTERVENTION:   Continue Ensure Enlive po BID, each supplement provides 350 kcal and 20 grams of protein  NUTRITION DIAGNOSIS:   Increased nutrient needs related to post-op healing as evidenced by estimated needs.  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  Admitted for hip fracture. 2/22: s/p Intramedullary fixation, Right femur.   Patient reports her appetite "comes and goes". PTA she was eating normally, consuming 3 meals a day with good appetite. Pt now consuming 25-50% of meals. Pt states she really likes the strawberry Ensure and is drinking them with no issues. Encouraged supplements for 1-2 months post-op to support healing from surgery.   Per weight records, pt has lost 7 lb since 02/15/18 (7% wt loss x 6 months, insignificant for time frame). Pt reports UBW of 105-110 lb.   Medications: K-DUR tablet once Labs reviewed: Low K   NUTRITION - FOCUSED PHYSICAL EXAM:    Most Recent Value  Orbital Region  No depletion  Upper Arm Region  Mild depletion  Thoracic and Lumbar Region  Unable to assess  Buccal Region  Mild depletion  Temple Region  Mild depletion  Clavicle Bone Region  Mild depletion  Clavicle and Acromion Bone Region  Mild depletion  Scapular Bone Region  Unable to assess  Dorsal Hand  Moderate depletion  Patellar Region  Unable to assess  Anterior Thigh Region  Unable to assess  Posterior Calf Region  Unable to assess  Edema (RD Assessment)  None        Diet Order:   Diet Order            Diet regular Room service appropriate? Yes; Fluid consistency: Thin  Diet effective now              EDUCATION NEEDS:   Education needs have been addressed  Skin:  Skin Assessment: Reviewed RN Assessment  Last BM:  2/24  Height:   Ht Readings from Last 1 Encounters:  08/18/18 5\' 2"  (1.575 m)    Weight:   Wt Readings from Last 1 Encounters:  08/18/18 43.5 kg    Ideal Body Weight:  50 kg  BMI:  Body mass index is 17.56 kg/m.  Estimated Nutritional Needs:   Kcal:  1300-1500  Protein:  60-70g  Fluid:  1.5L/day   [name], MS, RD, LDN [facility] Inpatient Clinical Dietitian Pager: [phone] After Hours Pager: [phone]

## 2018-08-22 DIAGNOSIS — J449 Chronic obstructive pulmonary disease, unspecified: Secondary | ICD-10-CM | POA: Diagnosis not present

## 2018-08-22 DIAGNOSIS — R2681 Unsteadiness on feet: Secondary | ICD-10-CM | POA: Diagnosis not present

## 2018-08-22 DIAGNOSIS — K5900 Constipation, unspecified: Secondary | ICD-10-CM | POA: Diagnosis not present

## 2018-08-22 DIAGNOSIS — S72141D Displaced intertrochanteric fracture of right femur, subsequent encounter for closed fracture with routine healing: Secondary | ICD-10-CM | POA: Diagnosis not present

## 2018-08-22 DIAGNOSIS — J439 Emphysema, unspecified: Secondary | ICD-10-CM | POA: Diagnosis not present

## 2018-08-22 DIAGNOSIS — Z4789 Encounter for other orthopedic aftercare: Secondary | ICD-10-CM | POA: Diagnosis not present

## 2018-08-22 DIAGNOSIS — Z5189 Encounter for other specified aftercare: Secondary | ICD-10-CM | POA: Diagnosis not present

## 2018-08-22 DIAGNOSIS — D72829 Elevated white blood cell count, unspecified: Secondary | ICD-10-CM | POA: Diagnosis not present

## 2018-08-22 DIAGNOSIS — S72141A Displaced intertrochanteric fracture of right femur, initial encounter for closed fracture: Secondary | ICD-10-CM | POA: Diagnosis not present

## 2018-08-22 DIAGNOSIS — Z7901 Long term (current) use of anticoagulants: Secondary | ICD-10-CM | POA: Diagnosis not present

## 2018-08-22 DIAGNOSIS — I82409 Acute embolism and thrombosis of unspecified deep veins of unspecified lower extremity: Secondary | ICD-10-CM | POA: Diagnosis not present

## 2018-08-22 DIAGNOSIS — M6281 Muscle weakness (generalized): Secondary | ICD-10-CM | POA: Diagnosis not present

## 2018-08-22 DIAGNOSIS — M62838 Other muscle spasm: Secondary | ICD-10-CM | POA: Diagnosis not present

## 2018-08-22 DIAGNOSIS — R410 Disorientation, unspecified: Secondary | ICD-10-CM | POA: Diagnosis not present

## 2018-08-22 DIAGNOSIS — R600 Localized edema: Secondary | ICD-10-CM | POA: Diagnosis not present

## 2018-08-22 DIAGNOSIS — S8011XD Contusion of right lower leg, subsequent encounter: Secondary | ICD-10-CM | POA: Diagnosis not present

## 2018-08-22 DIAGNOSIS — E7849 Other hyperlipidemia: Secondary | ICD-10-CM | POA: Diagnosis not present

## 2018-08-22 DIAGNOSIS — D649 Anemia, unspecified: Secondary | ICD-10-CM | POA: Diagnosis not present

## 2018-08-22 DIAGNOSIS — M1990 Unspecified osteoarthritis, unspecified site: Secondary | ICD-10-CM | POA: Diagnosis not present

## 2018-08-22 DIAGNOSIS — E785 Hyperlipidemia, unspecified: Secondary | ICD-10-CM | POA: Diagnosis not present

## 2018-08-22 DIAGNOSIS — S72001A Fracture of unspecified part of neck of right femur, initial encounter for closed fracture: Secondary | ICD-10-CM | POA: Diagnosis not present

## 2018-08-22 DIAGNOSIS — Z79899 Other long term (current) drug therapy: Secondary | ICD-10-CM | POA: Diagnosis not present

## 2018-08-22 DIAGNOSIS — M25551 Pain in right hip: Secondary | ICD-10-CM | POA: Diagnosis not present

## 2018-08-22 DIAGNOSIS — L8990 Pressure ulcer of unspecified site, unspecified stage: Secondary | ICD-10-CM

## 2018-08-22 DIAGNOSIS — R278 Other lack of coordination: Secondary | ICD-10-CM | POA: Diagnosis not present

## 2018-08-22 DIAGNOSIS — M810 Age-related osteoporosis without current pathological fracture: Secondary | ICD-10-CM | POA: Diagnosis not present

## 2018-08-22 LAB — BASIC METABOLIC PANEL
Anion gap: 7 (ref 5–15)
BUN: 14 mg/dL (ref 8–23)
CO2: 24 mmol/L (ref 22–32)
Calcium: 8.4 mg/dL — ABNORMAL LOW (ref 8.9–10.3)
Chloride: 108 mmol/L (ref 98–111)
Creatinine, Ser: 0.47 mg/dL (ref 0.44–1.00)
Glucose, Bld: 123 mg/dL — ABNORMAL HIGH (ref 70–99)
Potassium: 4.3 mmol/L (ref 3.5–5.1)
SODIUM: 139 mmol/L (ref 135–145)

## 2018-08-22 LAB — CBC
HCT: 32.4 % — ABNORMAL LOW (ref 36.0–46.0)
Hemoglobin: 10.6 g/dL — ABNORMAL LOW (ref 12.0–15.0)
MCH: 30.6 pg (ref 26.0–34.0)
MCHC: 32.7 g/dL (ref 30.0–36.0)
MCV: 93.6 fL (ref 80.0–100.0)
Platelets: 201 10*3/uL (ref 150–400)
RBC: 3.46 MIL/uL — ABNORMAL LOW (ref 3.87–5.11)
RDW: 15.9 % — ABNORMAL HIGH (ref 11.5–15.5)
WBC: 9.8 10*3/uL (ref 4.0–10.5)
nRBC: 0.2 % (ref 0.0–0.2)

## 2018-08-22 LAB — MAGNESIUM: Magnesium: 1.9 mg/dL (ref 1.7–2.4)

## 2018-08-22 MED ORDER — ENOXAPARIN SODIUM 30 MG/0.3ML ~~LOC~~ SOLN: 30.0000 mg | SUBCUTANEOUS | 0 refills | Status: DC

## 2018-08-22 MED ORDER — BISACODYL 5 MG PO TBEC: 10.0000 mg | DELAYED_RELEASE_TABLET | Freq: Every day | ORAL | 0 refills | Status: DC

## 2018-08-22 MED ORDER — METHOCARBAMOL 500 MG PO TABS: 500.0000 mg | ORAL_TABLET | Freq: Four times a day (QID) | ORAL | 0 refills | Status: DC | PRN

## 2018-08-22 MED ORDER — HYDROCODONE-ACETAMINOPHEN 7.5-325 MG PO TABS: 1.0000 | ORAL_TABLET | ORAL | 0 refills | Status: DC | PRN

## 2018-08-22 MED ORDER — BISACODYL 5 MG PO TBEC
10.0000 mg | DELAYED_RELEASE_TABLET | Freq: Every day | ORAL | Status: DC
  Administered 2018-08-22: 10 mg via ORAL
  Filled 2018-08-22: qty 2

## 2018-08-22 NOTE — Discharge Summary (Signed)
Physician Discharge Summary  Eileen Kidd:[id number] DOB: [birth year] DOA: 08/18/2018  PCP: Nils Pyle, MD  Admit date: 08/18/2018 Discharge date: 08/22/2018  Admitted From: Home  disposition: Nursing home  Recommendations for Outpatient Follow-up:  1. Follow up with PCP in 1-2 weeks 2. Please obtain BMP/CBC in one week 3. Please follow up with orthopedics Dr. 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 She feel on her right buttock and hip area. She was unable to stand since the injury. She denies chest pain, shortness of breath, syncope, numbness/tingling. She did not pass out and she did not hit her head. She denies abdominal pain, n/v/d and fever.   ED Course: Pt was evaluated by ED providers and noted to be in severe right hip pain. Pt was sent to xray which confirmed acute comminuted right hip fracture. The patient is followed by orthopedist Dr. Lequita Halt with [facility]. His on call colleague Dr. Veda Canning requested admission to [facility] and for patient to remain NPO and will see the patient at [facility].   Discharge Diagnoses:  Principal Problem:   Closed right hip fracture (HCC) Active Problems:   Osteoporosis   Dyslipidemia   Arthritis   Leukocytosis   Right hip pain   Closed comminuted intertrochanteric fracture of right femur (HCC)   Pressure injury of skin   Closed comminuted intertrochanteric fracture of right femur Patient presenting as transfer from [facility] following mechanical fall at home.  Found to have a closed intertrochanteric fracture  right femur.  Underwent right femoral intramedullary nailing by orthopedics on 08/18/2018. --Weightbearing as tolerated per orthopedics with walker --Norco as needed,  Robaxin--On Lovenox for DVT prophylaxis --PT  recommend SNF  Confusion/hospital-acquired delirium Postoperatively, patient became progressively confused even removing one of her leg dressings.  A urinalysis was performed that was unremarkable.  She is afebrile no other infectious etiology elucidated.  Etiology likely hospital-acquired delirium coupled with her acute blood loss anemia as below.  Acute postoperative blood loss anemia Hemoglobin notably trended down from 12.7--> 8.7-->6.7 on postop day #1.  Hemodynamically stable, no signs of active bleeding. --transfused 2 units PRBCs 2/24 --Hemoglobin up to 9.5  Hypoxia, suspect atelectasis following surgical intervention: Resolved Patient with oxygen saturation this morning of 87%.  Etiology likely atelectasis.  Lungs are clear to auscultation. --Titrated off of oxygen, now on room air with adequate saturation --Incentive spirometry  Leukocytosis Etiology likely reactive from acute femur fracture/fall.  Afebrile. --WBC improving; 19-->12.3-->8.3   Dyslipidemia: Continue Crestor  Pressure Injury 08/21/18 Stage II -  Partial thickness loss of dermis presenting as a shallow open ulcer with a red, pink wound bed without slough. (Active)  08/21/18 0800  Location: Sacrum  Location Orientation:   Staging: Stage II -  Partial thickness loss of dermis presenting as a shallow open ulcer with a red, pink wound bed without slough.  Wound Description (Comments):   Present on Admission:       Nutrition Problem: Increased nutrient needs Etiology: post-op healing    Signs/Symptoms: estimated needs     Interventions: Ensure Enlive (each supplement provides 350kcal and 20 grams of protein)  Estimated body mass  index is 17.56 kg/m as calculated from the following:    Height as of this encounter: 5\' 2"  (1.575 m).   Weight as of this encounter: 43.5 kg.  Discharge Instructions  Discharge Instructions    Call MD for:  persistant dizziness or light-headedness   Complete by:  As directed    Call MD for:  persistant nausea and vomiting   Complete by:  As directed    Call MD for:  redness, tenderness, or signs of infection (pain, swelling, redness, odor or green/yellow discharge around incision site)   Complete by:  As directed    Call MD for:  severe uncontrolled pain   Complete by:  As directed    Call MD for:  temperature >100.4   Complete by:  As directed    Diet - low sodium heart healthy   Complete by:  As directed    Increase activity slowly   Complete by:  As directed      Allergies as of 08/22/2018      Reactions   Alendronate    espophagitis / GI pain / unsteady gait      Medication List    TAKE these medications   bisacodyl 5 MG EC tablet Commonly known as:  DULCOLAX Take 2 tablets (10 mg total) by mouth daily.   denosumab 60 MG/ML Soln injection Commonly known as:  PROLIA Inject 60 mg into the skin every 6 (six) months. Administer in upper arm, thigh, or abdomen   enoxaparin 30 MG/0.3ML injection Commonly known as:  LOVENOX Inject 0.3 mLs (30 mg total) into the skin daily. Start taking on:  August 23, 2018   HYDROcodone-acetaminophen 7.5-325 MG tablet Commonly known as:  NORCO Take 1-2 tablets by mouth every 4 (four) hours as needed for severe pain (pain score 7-10).   methocarbamol 500 MG tablet Commonly known as:  ROBAXIN Take 1 tablet (500 mg total) by mouth every 6 (six) hours as needed for muscle spasms.   MULTIVITAMIN & MINERAL PO Take 1 tablet by mouth daily.   rosuvastatin 20 MG tablet Commonly known as:  CRESTOR TAKE 1 TABLET BY MOUTH EVERY DAY IN THE EVENING What changed:    how much to take  how to take this      Follow-up Information    Swinteck, Arlys John, MD. Schedule an appointment as soon as  possible for a visit in 2 weeks.   Specialty:  Orthopedic Surgery Why:  For wound re-check Contact information: [address] [place] [address] [phone]        Dettinger, Elige Radon, MD Follow up.   Specialties:  Family Medicine, Cardiology Contact information: [address] [place] [address] [phone]          Allergies  Allergen Reactions  . Alendronate     espophagitis / GI pain / unsteady gait    Consultations:  Ortho   Procedures/Studies: Dg Chest 1 View  Result Date: 08/18/2018 CLINICAL DATA:  The patient suffered a right hip fracture in a fall today. EXAM: CHEST  1 VIEW COMPARISON:  PA and lateral chest 07/28/2017. FINDINGS: The lungs are emphysematous. No consolidative process, pneumothorax or effusion. Heart size is normal. Aortic atherosclerosis noted. No acute or focal bony abnormality. IMPRESSION: No acute disease. Emphysema. Atherosclerosis. Electronically Signed   By: Drusilla Kanner M.D.   On: 08/18/2018 13:36   Pelvis Portable  Result Date: 08/18/2018 CLINICAL DATA:  Postop IM rod EXAM: PORTABLE PELVIS 1-2 VIEWS COMPARISON:  08/18/2018 FINDINGS: Interval internal fixation across the right femoral intertrochanteric fracture. The lesser trochanter remains mildly displaced. Otherwise near anatomic alignment. No hardware no complicating feature. IMPRESSION: Internal fixation across the right femoral intertrochanteric fracture without visible complicating feature. Electronically Signed   By: Charlett Nose M.D.   On: 08/18/2018 20:46   Dg C-arm 1-60 Min-no Report  Result Date: 08/18/2018 Fluoroscopy was utilized by the requesting physician.  No radiographic interpretation.   Dg Hip Operative Unilat W Or W/o Pelvis Right  Result Date: 08/18/2018 CLINICAL DATA:  Right femur fracture EXAM: OPERATIVE right HIP (WITH PELVIS IF PERFORMED) 4 VIEWS TECHNIQUE: Fluoroscopic spot image(s) were submitted for interpretation post-operatively.  COMPARISON:  08/18/2018 FINDINGS: Four low resolution intraoperative spot views of the right femur. Images demonstrate intramedullary rod and distal screw fixation of right intertrochanteric fracture. IMPRESSION: Intraoperative fluoroscopic assistance provided during surgical fixation of right femur fracture Electronically Signed   By: Jasmine Pang M.D.   On: 08/18/2018 21:49   Dg Hip Unilat With Pelvis 2-3 Views Right  Result Date: 08/18/2018 CLINICAL DATA:  Fall with right hip pain. EXAM: DG HIP (WITH OR WITHOUT PELVIS) 2-3V RIGHT COMPARISON:  None. FINDINGS: Frontal pelvis shows diffuse bony demineralization. SI joints and symphysis pubis unremarkable. Left hip unremarkable. Comminuted right intertrochanteric femoral neck fracture with varus angulation evident. IMPRESSION: Comminuted right intertrochanteric femoral neck fracture with varus angulation. Electronically Signed   By: Kennith Center M.D.   On: 08/18/2018 13:36   Dg Femur 1v Right  Result Date: 08/18/2018 CLINICAL DATA:  Fall with pain. EXAM: RIGHT FEMUR 1 VIEW COMPARISON:  None. FINDINGS: A single view of the right femur was obtained. There is a comminuted right intertrochanteric femoral neck fracture in the main distal femur fraction appears laterally rotated. No mid or distal right femur fracture. IMPRESSION: Comminuted right intertrochanteric femoral neck fracture. No mid or distal femur fracture evident. Electronically Signed   By: Kennith Center M.D.   On: 08/18/2018 13:37   Dg Femur, Min 2 Views Right  Result Date: 08/18/2018 CLINICAL DATA:  Postop EXAM: RIGHT FEMUR 2 VIEWS COMPARISON:  08/18/2018 FINDINGS: Cutaneous staples over the right hip. Interval intramedullary rod and distal screw fixation of the femur across comminuted intertrochanteric fracture. Displaced lesser trochanteric fracture fragment. Gas in the soft tissues consistent with recent surgery. Vascular calcifications. IMPRESSION: Interval intramedullary rod and screw  fixation of the right femur for comminuted intertrochanteric femur fracture Electronically Signed   By: Jasmine Pang M.D.   On: 08/18/2018 20:19   (Echo, Carotid, EGD, Colonoscopy, ERCP)    Subjective:   Discharge Exam: Vitals:   08/21/18 2044 08/22/18 0451  BP: (!) 143/45 (!) 131/52  Pulse: 77 79  Resp: 15 16  Temp: 98.1 F (36.7 C) 98.3 F (36.8 C)  SpO2: 99% 96%   Vitals:   08/21/18 0516 08/21/18 1318 08/21/18 2044 08/22/18 0451  BP: (!) 120/37 (!) 135/53 (!) 143/45 (!) 131/52  Pulse: 72 86 77 79  Resp: Temp: 98.4 F (36.9 C) 98.4 F (36.9 C) 98.1 F (36.7 C) 98.3 F (36.8 C)  TempSrc: Oral Oral Oral Oral  SpO2: 95% 97% 99% 96%  Weight:      Height:        General: Pt is alert, awake, not in acute distress Cardiovascular: RRR, S1/S2 +, no rubs, no gallops Respiratory: CTA bilaterally, no wheezing, no rhonchi Abdominal: Soft, NT, ND, bowel sounds + Extremities: no edema, no cyanosis  The results of significant diagnostics from this hospitalization (including imaging, microbiology, ancillary and laboratory) are listed below for reference.     Microbiology: Recent Results (from the past 240 hour(s))  Urine culture     Status: None   Collection Time: 08/20/18 12:09 AM  Result Value Ref Range Status   Specimen Description   Final    URINE, CATHETERIZED Performed at [facility], [address] [place], [place] [address]    Special Requests   Final    NONE Performed at [facility], [address] [place], [place] [address]    Culture   Final    NO GROWTH Performed at [facility], [address], [place], [place] [address]    Report Status 08/21/2018 FINAL  Final     Labs: BNP (last 3 results) No results for input(s): BNP in the last 8760 hours. Basic Metabolic Panel: Recent Labs  Lab 08/18/18 1240 08/19/18 0043 08/20/18 0747 08/21/18 0332 08/22/18 0523  NA 142 140 139 140 139  K 3.7 3.9 3.5 3.4* 4.3   CL 106 109 107 109 108  CO2 25 22 27 25 24   GLUCOSE 123* 162* 112* 122* 123*  BUN 12 15 20 16 14   CREATININE 0.52 0.72 0.59 0.48 0.47  CALCIUM 8.8* 7.3* 7.8* 7.9* 8.4*  MG  --   --   --   --  1.9   Liver Function Tests: No results for input(s): AST, ALT, ALKPHOS, BILITOT, PROT, ALBUMIN in the last 168 hours. No results for input(s): LIPASE, AMYLASE in the last 168 hours. No results for input(s): AMMONIA in the last 168 hours. CBC: Recent Labs  Lab 08/18/18 1240 08/19/18 0043 08/20/18 0747 08/21/18 0332 08/21/18 1207 08/22/18 0523  WBC 19.2* 12.3* 9.6 8.3  --  9.8  NEUTROABS 17.2*  --   --   --   --   --   HGB 12.7 8.7* 6.7* 9.5* 10.0* 10.6*  HCT 39.3 28.6* 21.2* 28.9* 30.4* 32.4*  MCV 94.9 99.0 98.1 90.9  --  93.6  PLT 309 225 183 150  --  201   Cardiac Enzymes: Recent Labs  Lab 08/18/18 1240  CKTOTAL 86  TROPONINI <0.03   BNP: Invalid input(s): POCBNP CBG: No results for input(s): GLUCAP in the last 168 hours. D-Dimer No results for input(s): DDIMER in the last 72 hours. Hgb A1c No results for input(s): HGBA1C in the last 72 hours. Lipid Profile No results for input(s): CHOL, HDL, LDLCALC, TRIG, CHOLHDL, LDLDIRECT in the last 72 hours. Thyroid function studies No results for input(s): TSH, T4TOTAL, T3FREE, THYROIDAB in the last 72 hours.  Invalid input(s): FREET3 Anemia work up No results for input(s): VITAMINB12, FOLATE, FERRITIN, TIBC, IRON, RETICCTPCT in the last 72 hours. Urinalysis    Component Value Date/Time   COLORURINE YELLOW 08/20/2018 0009   APPEARANCEUR HAZY (A) 08/20/2018 0009   LABSPEC 1.018 08/20/2018 0009   PHURINE 5.0 08/20/2018 0009   GLUCOSEU NEGATIVE 08/20/2018 0009   HGBUR NEGATIVE 08/20/2018 0009   BILIRUBINUR NEGATIVE 08/20/2018 0009   KETONESUR 5 (A) 08/20/2018 0009   PROTEINUR NEGATIVE 08/20/2018 0009   UROBILINOGEN 1.0 08/06/2009 1216   NITRITE NEGATIVE 08/20/2018 0009   LEUKOCYTESUR MODERATE (A) 08/20/2018 0009   Sepsis  Labs Invalid input(s): PROCALCITONIN,  WBC,  LACTICIDVEN Microbiology Recent Results (from the past 240 hour(s))  Urine culture     Status: None   Collection Time: 08/20/18 12:09 AM  Result Value Ref Range Status  Specimen Description   Final    URINE, CATHETERIZED Performed at [facility], [address] [place], [place] [address]    Special Requests   Final    NONE Performed at [facility], [address] [place], [place] [address]    Culture   Final    NO GROWTH Performed at [facility], [address] [place], [place] [address]    Report Status 08/21/2018 FINAL  Final     Time coordinating discharge: Over 30 minutes  SIGNED:   Alwyn Ren, MD  [facility] 08/22/2018, 10:06 AM Pager   If 7PM-7AM, please contact night-coverage [email or web address] Password [email or web address]

## 2018-08-22 NOTE — Care Management Note (Signed)
Case Management Note  Patient Details  Name: SESHA SZEWCZYK MRN: [id number] Date of Birth: 24-Jan-1941  Subjective/Objective:                  Discharge planning  Action/Plan: Plan is to go to snf at discharge  Expected Discharge Date:  08/22/18               Expected Discharge Plan:  Skilled Nursing Facility  In-House Referral:  Clinical Social Work  Discharge planning Services  CM Consult  Post Acute Care Choice:    Choice offered to:     DME Arranged:    DME Agency:     HH Arranged:    HH Agency:     Status of Service:  Completed, signed off  If discussed at [employer], dates discussed:    Additional Comments:  Golda Acre, RN 08/22/2018, 10:15 AM

## 2018-08-22 NOTE — Clinical Social Work Placement (Signed)
D/C Summary sent.  Nurse call report [phone] Daughter will transport.   CLINICAL SOCIAL WORK PLACEMENT  NOTE  Date:  08/22/2018  Patient Details  Name: Eileen Kidd MRN: [id number] Date of Birth: 02-Nov-1940  Clinical Social Work is seeking post-discharge placement for this patient at the Skilled  Nursing Facility level of care (*CSW will initial, date and re-position this form in  chart as items are completed):  Yes   Patient/family provided with [place] Clinical Social Work Department's list of facilities offering this level of care within the geographic area requested by the patient (or if unable, by the patient's family).  Yes   Patient/family informed of their freedom to choose among providers that offer the needed level of care, that participate in Medicare, Medicaid or managed care program needed by the patient, have an available bed and are willing to accept the patient.  Yes   Patient/family informed of [facility]'s ownership interest in [facility] and [facility], as well as of the fact that they are under no obligation to receive care at these facilities.  PASRR submitted to EDS on       PASRR number received on       Existing PASRR number confirmed on       FL2 transmitted to all facilities in geographic area requested by pt/family on       FL2 transmitted to all facilities within larger geographic area on       Patient informed that his/her managed care company has contracts with or will negotiate with certain facilities, including the following:  [facility] [place]     Yes   Patient/family informed of bed offers received.  Patient chooses bed at [facility] [place]     Physician recommends and patient chooses bed at      Patient to be transferred to [facility] [place] [facility] on 08/22/18.  Patient to be transferred to facility by Daughter      Patient family notified on 08/22/18 of transfer.  Name of family member notified:   Daughter [facility]     PHYSICIAN       Additional Comment:    _______________________________________________ Clearance Coots, LCSW 08/22/2018, 11:02 AM

## 2018-08-22 NOTE — Progress Notes (Signed)
Report called to RN at [facility] [facility] [place]. Patient being transferred by daughter.

## 2018-08-22 NOTE — Care Management Important Message (Signed)
Important Message  Patient Details  Name: URSA MARCHESI MRN: [id number] Date of Birth: 10/03/1940   Medicare Important Message Given:  Yes    Caren Macadam 08/22/2018, 11:40 AMImportant Message  Patient Details  Name: ABBIGAIL REGIS MRN: [id number] Date of Birth: [birth year]   Medicare Important Message Given:  Yes    Caren Macadam 08/22/2018, 11:40 AM

## 2018-08-22 NOTE — Plan of Care (Signed)
  Problem: Activity: Goal: Risk for activity intolerance will decrease Outcome: Progressing   Problem: Coping: Goal: Level of anxiety will decrease Outcome: Progressing   Problem: Pain Managment: Goal: General experience of comfort will improve Outcome: Progressing   

## 2018-08-25 DIAGNOSIS — E7849 Other hyperlipidemia: Secondary | ICD-10-CM | POA: Diagnosis not present

## 2018-08-25 DIAGNOSIS — I82409 Acute embolism and thrombosis of unspecified deep veins of unspecified lower extremity: Secondary | ICD-10-CM | POA: Diagnosis not present

## 2018-08-25 DIAGNOSIS — J449 Chronic obstructive pulmonary disease, unspecified: Secondary | ICD-10-CM | POA: Diagnosis not present

## 2018-08-25 DIAGNOSIS — S72141D Displaced intertrochanteric fracture of right femur, subsequent encounter for closed fracture with routine healing: Secondary | ICD-10-CM | POA: Diagnosis not present

## 2018-08-28 DIAGNOSIS — S72141D Displaced intertrochanteric fracture of right femur, subsequent encounter for closed fracture with routine healing: Secondary | ICD-10-CM | POA: Diagnosis not present

## 2018-09-09 DIAGNOSIS — J449 Chronic obstructive pulmonary disease, unspecified: Secondary | ICD-10-CM | POA: Diagnosis not present

## 2018-09-09 DIAGNOSIS — S72141D Displaced intertrochanteric fracture of right femur, subsequent encounter for closed fracture with routine healing: Secondary | ICD-10-CM | POA: Diagnosis not present

## 2018-09-09 DIAGNOSIS — S8011XD Contusion of right lower leg, subsequent encounter: Secondary | ICD-10-CM | POA: Diagnosis not present

## 2018-09-09 DIAGNOSIS — R600 Localized edema: Secondary | ICD-10-CM | POA: Diagnosis not present

## 2018-09-17 ENCOUNTER — Telehealth (INDEPENDENT_AMBULATORY_CARE_PROVIDER_SITE_OTHER): Payer: Medicare Other | Admitting: Family Medicine

## 2018-09-17 ENCOUNTER — Other Ambulatory Visit: Payer: Self-pay

## 2018-09-17 DIAGNOSIS — M170 Bilateral primary osteoarthritis of knee: Secondary | ICD-10-CM | POA: Diagnosis not present

## 2018-09-17 DIAGNOSIS — S72141S Displaced intertrochanteric fracture of right femur, sequela: Secondary | ICD-10-CM

## 2018-09-17 DIAGNOSIS — D509 Iron deficiency anemia, unspecified: Secondary | ICD-10-CM | POA: Diagnosis not present

## 2018-09-17 DIAGNOSIS — S72001S Fracture of unspecified part of neck of right femur, sequela: Secondary | ICD-10-CM

## 2018-09-17 DIAGNOSIS — S72141D Displaced intertrochanteric fracture of right femur, subsequent encounter for closed fracture with routine healing: Secondary | ICD-10-CM | POA: Diagnosis not present

## 2018-09-17 DIAGNOSIS — E785 Hyperlipidemia, unspecified: Secondary | ICD-10-CM | POA: Diagnosis not present

## 2018-09-17 DIAGNOSIS — J449 Chronic obstructive pulmonary disease, unspecified: Secondary | ICD-10-CM | POA: Diagnosis not present

## 2018-09-17 NOTE — Progress Notes (Signed)
Virtual Visit via telephone Note  I connected with Eileen Kidd on 09/17/18 at 1439 by telephone and verified that I am speaking with the correct person using two identifiers. Eileen Kidd is currently located at home and friend are currently with her during visit. The provider, Elige Radon Dettinger, MD is located in their office at time of visit.  Call ended at 1445  I discussed the limitations, risks, security and privacy concerns of performing an evaluation and management service by telephone and the availability of in person appointments. I also discussed with the patient that there may be a patient responsible charge related to this service. The patient expressed understanding and agreed to proceed.   History and Present Illness: Patient is using walker and pain is doing a lot better and uses hydrocodone sparingly.  She had a fall on 08/18/2018 while getting up in the middle of night and had a surgery to repair.  Scar is looking good and healing well.  She is feeling like she is doing very well and has her friend and neighbor close by to help monitor her.  She is doing exercises and therapies and stretches at home still to try and build up her energy and strength  No diagnosis found.  Outpatient Encounter Medications as of 09/17/2018  Medication Sig  . bisacodyl (DULCOLAX) 5 MG EC tablet Take 2 tablets (10 mg total) by mouth daily.  Marland Kitchen denosumab (PROLIA) 60 MG/ML SOLN injection Inject 60 mg into the skin every 6 (six) months. Administer in upper arm, thigh, or abdomen  . enoxaparin (LOVENOX) 30 MG/0.3ML injection Inject 0.3 mLs (30 mg total) into the skin daily.  Marland Kitchen HYDROcodone-acetaminophen (NORCO) 7.5-325 MG tablet Take 1-2 tablets by mouth every 4 (four) hours as needed for severe pain (pain score 7-10).  . methocarbamol (ROBAXIN) 500 MG tablet Take 1 tablet (500 mg total) by mouth every 6 (six) hours as needed for muscle spasms.  . Multiple Vitamins-Minerals (MULTIVITAMIN & MINERAL  PO) Take 1 tablet by mouth daily.   . rosuvastatin (CRESTOR) 20 MG tablet TAKE 1 TABLET BY MOUTH EVERY DAY IN THE EVENING (Patient taking differently: Take 20 mg by mouth every evening. )   No facility-administered encounter medications on file as of 09/17/2018.     Review of Systems  Constitutional: Negative for chills and fever.  Respiratory: Negative for chest tightness and shortness of breath.   Cardiovascular: Negative for chest pain and leg swelling.  Musculoskeletal: Positive for arthralgias. Negative for back pain, gait problem and joint swelling.  Skin: Positive for wound (Well-healed per patient). Negative for rash.  Neurological: Negative for light-headedness and headaches.  Psychiatric/Behavioral: Negative for agitation and behavioral problems.  All other systems reviewed and are negative.   Observations/Objective: Patient sounds a very positive and optimistic and sounds like things are going well for her.  Assessment and Plan: Problem List Items Addressed This Visit      Musculoskeletal and Integument   Closed right hip fracture (HCC) - Primary   Closed comminuted intertrochanteric fracture of right femur (HCC)       Follow Up Instructions: Continue to do therapies and exercises and if anything worsens or changes or she develops any signs of infection to give [place] a call back.    I discussed the assessment and treatment plan with the patient. The patient was provided an opportunity to ask questions and all were answered. The patient agreed with the plan and demonstrated an understanding of the instructions.  The patient was advised to call back or seek an in-person evaluation if the symptoms worsen or if the condition fails to improve as anticipated.  The above assessment and management plan was discussed with the patient. The patient verbalized understanding of and has agreed to the management plan. Patient is aware to call the clinic if symptoms persist or worsen.  Patient is aware when to return to the clinic for a follow-up visit. Patient educated on when it is appropriate to go to the emergency department.    I provided 5 minutes of non-face-to-face time during this encounter.    Nils Pyle, MD

## 2018-09-19 DIAGNOSIS — S72141D Displaced intertrochanteric fracture of right femur, subsequent encounter for closed fracture with routine healing: Secondary | ICD-10-CM | POA: Diagnosis not present

## 2018-09-19 DIAGNOSIS — M170 Bilateral primary osteoarthritis of knee: Secondary | ICD-10-CM | POA: Diagnosis not present

## 2018-09-19 DIAGNOSIS — D509 Iron deficiency anemia, unspecified: Secondary | ICD-10-CM | POA: Diagnosis not present

## 2018-09-19 DIAGNOSIS — J449 Chronic obstructive pulmonary disease, unspecified: Secondary | ICD-10-CM | POA: Diagnosis not present

## 2018-09-19 DIAGNOSIS — E785 Hyperlipidemia, unspecified: Secondary | ICD-10-CM | POA: Diagnosis not present

## 2018-09-25 DIAGNOSIS — S72141D Displaced intertrochanteric fracture of right femur, subsequent encounter for closed fracture with routine healing: Secondary | ICD-10-CM | POA: Diagnosis not present

## 2018-09-25 DIAGNOSIS — J449 Chronic obstructive pulmonary disease, unspecified: Secondary | ICD-10-CM | POA: Diagnosis not present

## 2018-09-25 DIAGNOSIS — D509 Iron deficiency anemia, unspecified: Secondary | ICD-10-CM | POA: Diagnosis not present

## 2018-09-25 DIAGNOSIS — E785 Hyperlipidemia, unspecified: Secondary | ICD-10-CM | POA: Diagnosis not present

## 2018-09-25 DIAGNOSIS — M170 Bilateral primary osteoarthritis of knee: Secondary | ICD-10-CM | POA: Diagnosis not present

## 2018-09-26 DIAGNOSIS — E785 Hyperlipidemia, unspecified: Secondary | ICD-10-CM | POA: Diagnosis not present

## 2018-09-26 DIAGNOSIS — D509 Iron deficiency anemia, unspecified: Secondary | ICD-10-CM | POA: Diagnosis not present

## 2018-09-26 DIAGNOSIS — M170 Bilateral primary osteoarthritis of knee: Secondary | ICD-10-CM | POA: Diagnosis not present

## 2018-09-26 DIAGNOSIS — S72141D Displaced intertrochanteric fracture of right femur, subsequent encounter for closed fracture with routine healing: Secondary | ICD-10-CM | POA: Diagnosis not present

## 2018-09-26 DIAGNOSIS — J449 Chronic obstructive pulmonary disease, unspecified: Secondary | ICD-10-CM | POA: Diagnosis not present

## 2018-09-27 DIAGNOSIS — M170 Bilateral primary osteoarthritis of knee: Secondary | ICD-10-CM | POA: Diagnosis not present

## 2018-09-27 DIAGNOSIS — S72141D Displaced intertrochanteric fracture of right femur, subsequent encounter for closed fracture with routine healing: Secondary | ICD-10-CM | POA: Diagnosis not present

## 2018-09-27 DIAGNOSIS — D509 Iron deficiency anemia, unspecified: Secondary | ICD-10-CM | POA: Diagnosis not present

## 2018-09-27 DIAGNOSIS — E785 Hyperlipidemia, unspecified: Secondary | ICD-10-CM | POA: Diagnosis not present

## 2018-09-27 DIAGNOSIS — J449 Chronic obstructive pulmonary disease, unspecified: Secondary | ICD-10-CM | POA: Diagnosis not present

## 2018-10-01 DIAGNOSIS — E785 Hyperlipidemia, unspecified: Secondary | ICD-10-CM | POA: Diagnosis not present

## 2018-10-01 DIAGNOSIS — J449 Chronic obstructive pulmonary disease, unspecified: Secondary | ICD-10-CM | POA: Diagnosis not present

## 2018-10-01 DIAGNOSIS — S72141D Displaced intertrochanteric fracture of right femur, subsequent encounter for closed fracture with routine healing: Secondary | ICD-10-CM | POA: Diagnosis not present

## 2018-10-01 DIAGNOSIS — D509 Iron deficiency anemia, unspecified: Secondary | ICD-10-CM | POA: Diagnosis not present

## 2018-10-01 DIAGNOSIS — M170 Bilateral primary osteoarthritis of knee: Secondary | ICD-10-CM | POA: Diagnosis not present

## 2018-10-02 DIAGNOSIS — S72141D Displaced intertrochanteric fracture of right femur, subsequent encounter for closed fracture with routine healing: Secondary | ICD-10-CM | POA: Diagnosis not present

## 2018-10-03 ENCOUNTER — Other Ambulatory Visit: Payer: Self-pay

## 2018-10-03 ENCOUNTER — Ambulatory Visit (INDEPENDENT_AMBULATORY_CARE_PROVIDER_SITE_OTHER): Payer: Medicare Other

## 2018-10-03 DIAGNOSIS — E785 Hyperlipidemia, unspecified: Secondary | ICD-10-CM

## 2018-10-03 DIAGNOSIS — J449 Chronic obstructive pulmonary disease, unspecified: Secondary | ICD-10-CM | POA: Diagnosis not present

## 2018-10-03 DIAGNOSIS — D509 Iron deficiency anemia, unspecified: Secondary | ICD-10-CM

## 2018-10-03 DIAGNOSIS — S72141D Displaced intertrochanteric fracture of right femur, subsequent encounter for closed fracture with routine healing: Secondary | ICD-10-CM | POA: Diagnosis not present

## 2018-10-03 DIAGNOSIS — M170 Bilateral primary osteoarthritis of knee: Secondary | ICD-10-CM | POA: Diagnosis not present

## 2018-10-03 DIAGNOSIS — Z9181 History of falling: Secondary | ICD-10-CM

## 2018-10-05 DIAGNOSIS — M170 Bilateral primary osteoarthritis of knee: Secondary | ICD-10-CM | POA: Diagnosis not present

## 2018-10-05 DIAGNOSIS — E785 Hyperlipidemia, unspecified: Secondary | ICD-10-CM | POA: Diagnosis not present

## 2018-10-05 DIAGNOSIS — J449 Chronic obstructive pulmonary disease, unspecified: Secondary | ICD-10-CM | POA: Diagnosis not present

## 2018-10-05 DIAGNOSIS — S72141D Displaced intertrochanteric fracture of right femur, subsequent encounter for closed fracture with routine healing: Secondary | ICD-10-CM | POA: Diagnosis not present

## 2018-10-05 DIAGNOSIS — D509 Iron deficiency anemia, unspecified: Secondary | ICD-10-CM | POA: Diagnosis not present

## 2018-10-08 DIAGNOSIS — E785 Hyperlipidemia, unspecified: Secondary | ICD-10-CM | POA: Diagnosis not present

## 2018-10-08 DIAGNOSIS — J449 Chronic obstructive pulmonary disease, unspecified: Secondary | ICD-10-CM | POA: Diagnosis not present

## 2018-10-08 DIAGNOSIS — D509 Iron deficiency anemia, unspecified: Secondary | ICD-10-CM | POA: Diagnosis not present

## 2018-10-08 DIAGNOSIS — S72141D Displaced intertrochanteric fracture of right femur, subsequent encounter for closed fracture with routine healing: Secondary | ICD-10-CM | POA: Diagnosis not present

## 2018-10-08 DIAGNOSIS — M170 Bilateral primary osteoarthritis of knee: Secondary | ICD-10-CM | POA: Diagnosis not present

## 2018-10-11 DIAGNOSIS — S72141D Displaced intertrochanteric fracture of right femur, subsequent encounter for closed fracture with routine healing: Secondary | ICD-10-CM | POA: Diagnosis not present

## 2018-10-11 DIAGNOSIS — E785 Hyperlipidemia, unspecified: Secondary | ICD-10-CM | POA: Diagnosis not present

## 2018-10-11 DIAGNOSIS — D509 Iron deficiency anemia, unspecified: Secondary | ICD-10-CM | POA: Diagnosis not present

## 2018-10-11 DIAGNOSIS — M170 Bilateral primary osteoarthritis of knee: Secondary | ICD-10-CM | POA: Diagnosis not present

## 2018-10-11 DIAGNOSIS — J449 Chronic obstructive pulmonary disease, unspecified: Secondary | ICD-10-CM | POA: Diagnosis not present

## 2018-10-12 DIAGNOSIS — D509 Iron deficiency anemia, unspecified: Secondary | ICD-10-CM | POA: Diagnosis not present

## 2018-10-12 DIAGNOSIS — M170 Bilateral primary osteoarthritis of knee: Secondary | ICD-10-CM | POA: Diagnosis not present

## 2018-10-12 DIAGNOSIS — E785 Hyperlipidemia, unspecified: Secondary | ICD-10-CM | POA: Diagnosis not present

## 2018-10-12 DIAGNOSIS — J449 Chronic obstructive pulmonary disease, unspecified: Secondary | ICD-10-CM | POA: Diagnosis not present

## 2018-10-12 DIAGNOSIS — S72141D Displaced intertrochanteric fracture of right femur, subsequent encounter for closed fracture with routine healing: Secondary | ICD-10-CM | POA: Diagnosis not present

## 2018-10-15 DIAGNOSIS — D509 Iron deficiency anemia, unspecified: Secondary | ICD-10-CM | POA: Diagnosis not present

## 2018-10-15 DIAGNOSIS — M170 Bilateral primary osteoarthritis of knee: Secondary | ICD-10-CM | POA: Diagnosis not present

## 2018-10-15 DIAGNOSIS — J449 Chronic obstructive pulmonary disease, unspecified: Secondary | ICD-10-CM | POA: Diagnosis not present

## 2018-10-15 DIAGNOSIS — S72141D Displaced intertrochanteric fracture of right femur, subsequent encounter for closed fracture with routine healing: Secondary | ICD-10-CM | POA: Diagnosis not present

## 2018-10-15 DIAGNOSIS — E785 Hyperlipidemia, unspecified: Secondary | ICD-10-CM | POA: Diagnosis not present

## 2018-10-17 DIAGNOSIS — D509 Iron deficiency anemia, unspecified: Secondary | ICD-10-CM | POA: Diagnosis not present

## 2018-10-17 DIAGNOSIS — S72141D Displaced intertrochanteric fracture of right femur, subsequent encounter for closed fracture with routine healing: Secondary | ICD-10-CM | POA: Diagnosis not present

## 2018-10-17 DIAGNOSIS — M170 Bilateral primary osteoarthritis of knee: Secondary | ICD-10-CM | POA: Diagnosis not present

## 2018-10-17 DIAGNOSIS — E785 Hyperlipidemia, unspecified: Secondary | ICD-10-CM | POA: Diagnosis not present

## 2018-10-17 DIAGNOSIS — J449 Chronic obstructive pulmonary disease, unspecified: Secondary | ICD-10-CM | POA: Diagnosis not present

## 2018-10-18 DIAGNOSIS — D509 Iron deficiency anemia, unspecified: Secondary | ICD-10-CM | POA: Diagnosis not present

## 2018-10-18 DIAGNOSIS — J449 Chronic obstructive pulmonary disease, unspecified: Secondary | ICD-10-CM | POA: Diagnosis not present

## 2018-10-18 DIAGNOSIS — M170 Bilateral primary osteoarthritis of knee: Secondary | ICD-10-CM | POA: Diagnosis not present

## 2018-10-18 DIAGNOSIS — S72141D Displaced intertrochanteric fracture of right femur, subsequent encounter for closed fracture with routine healing: Secondary | ICD-10-CM | POA: Diagnosis not present

## 2018-10-18 DIAGNOSIS — E785 Hyperlipidemia, unspecified: Secondary | ICD-10-CM | POA: Diagnosis not present

## 2018-10-23 DIAGNOSIS — E785 Hyperlipidemia, unspecified: Secondary | ICD-10-CM | POA: Diagnosis not present

## 2018-10-23 DIAGNOSIS — D509 Iron deficiency anemia, unspecified: Secondary | ICD-10-CM | POA: Diagnosis not present

## 2018-10-23 DIAGNOSIS — S72141D Displaced intertrochanteric fracture of right femur, subsequent encounter for closed fracture with routine healing: Secondary | ICD-10-CM | POA: Diagnosis not present

## 2018-10-23 DIAGNOSIS — M170 Bilateral primary osteoarthritis of knee: Secondary | ICD-10-CM | POA: Diagnosis not present

## 2018-10-23 DIAGNOSIS — J449 Chronic obstructive pulmonary disease, unspecified: Secondary | ICD-10-CM | POA: Diagnosis not present

## 2018-10-24 DIAGNOSIS — D509 Iron deficiency anemia, unspecified: Secondary | ICD-10-CM | POA: Diagnosis not present

## 2018-10-24 DIAGNOSIS — S72141D Displaced intertrochanteric fracture of right femur, subsequent encounter for closed fracture with routine healing: Secondary | ICD-10-CM | POA: Diagnosis not present

## 2018-10-24 DIAGNOSIS — J449 Chronic obstructive pulmonary disease, unspecified: Secondary | ICD-10-CM | POA: Diagnosis not present

## 2018-10-24 DIAGNOSIS — E785 Hyperlipidemia, unspecified: Secondary | ICD-10-CM | POA: Diagnosis not present

## 2018-10-24 DIAGNOSIS — M170 Bilateral primary osteoarthritis of knee: Secondary | ICD-10-CM | POA: Diagnosis not present

## 2018-10-31 DIAGNOSIS — M170 Bilateral primary osteoarthritis of knee: Secondary | ICD-10-CM | POA: Diagnosis not present

## 2018-10-31 DIAGNOSIS — J449 Chronic obstructive pulmonary disease, unspecified: Secondary | ICD-10-CM | POA: Diagnosis not present

## 2018-10-31 DIAGNOSIS — E785 Hyperlipidemia, unspecified: Secondary | ICD-10-CM | POA: Diagnosis not present

## 2018-10-31 DIAGNOSIS — D509 Iron deficiency anemia, unspecified: Secondary | ICD-10-CM | POA: Diagnosis not present

## 2018-10-31 DIAGNOSIS — S72141D Displaced intertrochanteric fracture of right femur, subsequent encounter for closed fracture with routine healing: Secondary | ICD-10-CM | POA: Diagnosis not present

## 2018-11-02 ENCOUNTER — Ambulatory Visit (INDEPENDENT_AMBULATORY_CARE_PROVIDER_SITE_OTHER): Payer: Medicare Other | Admitting: Family Medicine

## 2018-11-02 ENCOUNTER — Telehealth: Payer: Self-pay | Admitting: Family Medicine

## 2018-11-02 ENCOUNTER — Encounter: Payer: Self-pay | Admitting: Family Medicine

## 2018-11-02 ENCOUNTER — Other Ambulatory Visit: Payer: Self-pay

## 2018-11-02 DIAGNOSIS — M170 Bilateral primary osteoarthritis of knee: Secondary | ICD-10-CM | POA: Diagnosis not present

## 2018-11-02 DIAGNOSIS — E785 Hyperlipidemia, unspecified: Secondary | ICD-10-CM | POA: Diagnosis not present

## 2018-11-02 DIAGNOSIS — D509 Iron deficiency anemia, unspecified: Secondary | ICD-10-CM | POA: Diagnosis not present

## 2018-11-02 DIAGNOSIS — J449 Chronic obstructive pulmonary disease, unspecified: Secondary | ICD-10-CM | POA: Diagnosis not present

## 2018-11-02 DIAGNOSIS — S72141D Displaced intertrochanteric fracture of right femur, subsequent encounter for closed fracture with routine healing: Secondary | ICD-10-CM | POA: Diagnosis not present

## 2018-11-02 DIAGNOSIS — L03116 Cellulitis of left lower limb: Secondary | ICD-10-CM

## 2018-11-02 MED ORDER — DOXYCYCLINE HYCLATE 100 MG PO TABS: 100.0000 mg | ORAL_TABLET | Freq: Two times a day (BID) | ORAL | 0 refills | Status: AC

## 2018-11-02 NOTE — Telephone Encounter (Signed)
Going to do telephone appt

## 2018-11-02 NOTE — Progress Notes (Signed)
Virtual Visit via telephone Note Due to COVID-19, visit is conducted virtually and was requested by patient. This visit type was conducted due to national recommendations for restrictions regarding the COVID-19 Pandemic (e.g. social distancing) in an effort to limit this patient's exposure and mitigate transmission in our community. All issues noted in this document were discussed and addressed.  A physical exam was not performed with this format.   I connected with Eileen Kidd on 11/02/18 at 1300 by telephone and verified that I am speaking with the correct person using two identifiers. Eileen Kidd is currently located at home and caregiver is currently with them during visit. The provider, Kari BaarsMichelle Rakes, FNP is located in their office at time of visit.  I discussed the limitations, risks, security and privacy concerns of performing an evaluation and management service by telephone and the availability of in person appointments. I also discussed with the patient that there may be a patient responsible charge related to this service. The patient expressed understanding and agreed to proceed.  Subjective:  Patient ID: Eileen Kidd, female    DOB: May 01, 1941, 78 y.o.   MRN: [id number]  Chief Complaint:  Cellulitis   HPI: Eileen Kidd is a 78 y.o. female presenting on 11/02/2018 for Cellulitis   Pt reports left lower leg swelling and redness. Pt states she had a small scrape on her leg and now she has swelling and redness. She reports this started about 4 days ago and is getting worse. She denies fever, chills, weakness, calf swelling, chest pain, palpitations, shortness of breath, headaches, or dizziness. No confusion. She states the leg is tender, but not bad. No increased pain with flexing or extending her foot. No drainage from the wound.     Relevant past medical, surgical, family, and social history reviewed and updated as indicated.  Allergies and medications reviewed and  updated.   Past Medical History:  Diagnosis Date  . Arthritis    left knee  . Endometriosis   . History of fractured kneecap    LEFT  . Hyperlipidemia   . Osteoporosis     Past Surgical History:  Procedure Laterality Date  . ABDOMINAL HYSTERECTOMY Bilateral   . FEMUR IM NAIL Right 08/18/2018   Procedure: INTRAMEDULLARY (IM) FEMORAL NAILING;  Surgeon: Samson FredericSwinteck, Brian, MD;  Location: [facility];  Service: Orthopedics;  Laterality: Right;  . SPINE SURGERY     L5-S1 removal    Social History   Socioeconomic History  . Marital status: Legally Separated    Spouse name: Not on file  . Number of children: Not on file  . Years of education: Not on file  . Highest education level: Not on file  Occupational History  . Not on file  Social Needs  . Financial resource strain: Not on file  . Food insecurity:    Worry: Not on file    Inability: Not on file  . Transportation needs:    Medical: Not on file    Non-medical: Not on file  Tobacco Use  . Smoking status: Current Some Day Smoker    Years: 40.00    Types: Cigarettes  . Smokeless tobacco: Never Used  Substance and Sexual Activity  . Alcohol use: No  . Drug use: No  . Sexual activity: Never  Lifestyle  . Physical activity:    Days per week: Not on file    Minutes per session: Not on file  . Stress: Not on file  Relationships  .  Social connections:    Talks on phone: Not on file    Gets together: Not on file    Attends religious service: Not on file    Active member of club or organization: Not on file    Attends meetings of clubs or organizations: Not on file    Relationship status: Not on file  . Intimate partner violence:    Fear of current or ex partner: Not on file    Emotionally abused: Not on file    Physically abused: Not on file    Forced sexual activity: Not on file  Other Topics Concern  . Not on file  Social History Narrative  . Not on file    Outpatient Encounter Medications as of 11/02/2018   Medication Sig  . bisacodyl (DULCOLAX) 5 MG EC tablet Take 2 tablets (10 mg total) by mouth daily.  Marland Kitchen denosumab (PROLIA) 60 MG/ML SOLN injection Inject 60 mg into the skin every 6 (six) months. Administer in upper arm, thigh, or abdomen  . doxycycline (VIBRA-TABS) 100 MG tablet Take 1 tablet (100 mg total) by mouth 2 (two) times daily for 7 days. 1 po bid  . Multiple Vitamins-Minerals (MULTIVITAMIN & MINERAL PO) Take 1 tablet by mouth daily.   . rosuvastatin (CRESTOR) 20 MG tablet TAKE 1 TABLET BY MOUTH EVERY DAY IN THE EVENING (Patient taking differently: Take 20 mg by mouth every evening. )  . [DISCONTINUED] enoxaparin (LOVENOX) 30 MG/0.3ML injection Inject 0.3 mLs (30 mg total) into the skin daily.  . [DISCONTINUED] HYDROcodone-acetaminophen (NORCO) 7.5-325 MG tablet Take 1-2 tablets by mouth every 4 (four) hours as needed for severe pain (pain score 7-10).  . [DISCONTINUED] methocarbamol (ROBAXIN) 500 MG tablet Take 1 tablet (500 mg total) by mouth every 6 (six) hours as needed for muscle spasms.   No facility-administered encounter medications on file as of 11/02/2018.     Allergies  Allergen Reactions  . Alendronate     espophagitis / GI pain / unsteady gait    Review of Systems  Constitutional: Negative for diaphoresis, fatigue and fever.  Respiratory: Negative for cough, chest tightness, shortness of breath and wheezing.   Cardiovascular: Positive for leg swelling. Negative for chest pain and palpitations.  Gastrointestinal: Negative for abdominal distention.  Musculoskeletal: Positive for joint swelling.  Skin: Positive for color change and wound.  Neurological: Negative for dizziness, tremors, seizures, syncope, facial asymmetry, speech difficulty, weakness, light-headedness, numbness and headaches.  Psychiatric/Behavioral: Negative for confusion.  All other systems reviewed and are negative.        Observations/Objective: No vital signs or physical exam, this was a  telephone or virtual health encounter.  Pt alert and oriented, answers all questions appropriately, and able to speak in full sentences.    Assessment and Plan: Diagnoses and all orders for this visit:  Cellulitis of leg, left Due to reported symptoms along with recent wound, this is most likely cellulitis. Symptomatic care discussed. Antibiotics as prescribed. Report any new or worsening symptoms. Reevaluation in 1 week. -     doxycycline (VIBRA-TABS) 100 MG tablet; Take 1 tablet (100 mg total) by mouth 2 (two) times daily for 7 days. 1 po bid     Follow Up Instructions: Return in about 2 weeks (around 11/16/2018), or if symptoms worsen or fail to improve, for cellulitis.    I discussed the assessment and treatment plan with the patient. The patient was provided an opportunity to ask questions and all were answered. The patient agreed  with the plan and demonstrated an understanding of the instructions.   The patient was advised to call back or seek an in-person evaluation if the symptoms worsen or if the condition fails to improve as anticipated.  The above assessment and management plan was discussed with the patient. The patient verbalized understanding of and has agreed to the management plan. Patient is aware to call the clinic if symptoms persist or worsen. Patient is aware when to return to the clinic for a follow-up visit. Patient educated on when it is appropriate to go to the emergency department.    I provided 15 minutes of non-face-to-face time during this encounter. The call started at 1300. The call ended at 1315. The other time was used for coordination of care.    Kari Baars, FNP-C [facility] [address] [place], [place] [address] [phone]

## 2018-11-05 ENCOUNTER — Telehealth: Payer: Self-pay | Admitting: Family Medicine

## 2018-11-05 DIAGNOSIS — S72141D Displaced intertrochanteric fracture of right femur, subsequent encounter for closed fracture with routine healing: Secondary | ICD-10-CM | POA: Diagnosis not present

## 2018-11-05 DIAGNOSIS — E785 Hyperlipidemia, unspecified: Secondary | ICD-10-CM | POA: Diagnosis not present

## 2018-11-05 DIAGNOSIS — M170 Bilateral primary osteoarthritis of knee: Secondary | ICD-10-CM | POA: Diagnosis not present

## 2018-11-05 DIAGNOSIS — J449 Chronic obstructive pulmonary disease, unspecified: Secondary | ICD-10-CM | POA: Diagnosis not present

## 2018-11-05 DIAGNOSIS — D509 Iron deficiency anemia, unspecified: Secondary | ICD-10-CM | POA: Diagnosis not present

## 2018-11-05 NOTE — Telephone Encounter (Signed)
Okay to go ahead and renew the order for home health care

## 2018-11-05 NOTE — Telephone Encounter (Signed)
Ms. Livergood states that [facility] needs a note stating that she needs to continue physical therapy. Her therapy is due to end next week but her therapist, Maurine Minister, told her today that she needs more therapy.

## 2018-11-06 NOTE — Telephone Encounter (Signed)
VM left for Maurine Minister to extend PT

## 2018-11-09 ENCOUNTER — Ambulatory Visit (INDEPENDENT_AMBULATORY_CARE_PROVIDER_SITE_OTHER): Payer: Medicare Other | Admitting: Family Medicine

## 2018-11-09 ENCOUNTER — Other Ambulatory Visit: Payer: Self-pay

## 2018-11-09 ENCOUNTER — Encounter: Payer: Self-pay | Admitting: Family Medicine

## 2018-11-09 DIAGNOSIS — L03116 Cellulitis of left lower limb: Secondary | ICD-10-CM | POA: Diagnosis not present

## 2018-11-09 NOTE — Progress Notes (Signed)
Virtual Visit via telephone Note Due to COVID-19, visit is conducted virtually and was requested by patient. This visit type was conducted due to national recommendations for restrictions regarding the COVID-19 Pandemic (e.g. social distancing) in an effort to limit this patient's exposure and mitigate transmission in our community. All issues noted in this document were discussed and addressed.  A physical exam was not performed with this format.   I connected with Eileen Kidd on 11/09/18 at 0905 by telephone and verified that I am speaking with the correct person using two identifiers. Eileen Kidd is currently located at home and family is currently with them during visit. The provider, Kari BaarsMichelle Marjoria Mancillas, FNP is located in their office at time of visit.  I discussed the limitations, risks, security and privacy concerns of performing an evaluation and management service by telephone and the availability of in person appointments. I also discussed with the patient that there may be a patient responsible charge related to this service. The patient expressed understanding and agreed to proceed.  Subjective:  Patient ID: Eileen Kidd, female    DOB: 12-16-1940, 78 y.o.   MRN: [id number]  Chief Complaint:  Cellulitis   HPI: Eileen Kidd is a 78 y.o. female presenting on 11/09/2018 for Cellulitis   Pt following up for left lower leg cellulitis. Pt states she is taking her antibiotics as prescribed. She denies associated side effects. She states the redness and swelling in her leg has almost completely resolved. She denies fever, chills, weakness, fatigue, or confusion.     Relevant past medical, surgical, family, and social history reviewed and updated as indicated.  Allergies and medications reviewed and updated.   Past Medical History:  Diagnosis Date  . Arthritis    left knee  . Endometriosis   . History of fractured kneecap    LEFT  . Hyperlipidemia   . Osteoporosis      Past Surgical History:  Procedure Laterality Date  . ABDOMINAL HYSTERECTOMY Bilateral   . FEMUR IM NAIL Right 08/18/2018   Procedure: INTRAMEDULLARY (IM) FEMORAL NAILING;  Surgeon: Samson FredericSwinteck, Brian, MD;  Location: [facility];  Service: Orthopedics;  Laterality: Right;  . SPINE SURGERY     L5-S1 removal    Social History   Socioeconomic History  . Marital status: Legally Separated    Spouse name: Not on file  . Number of children: Not on file  . Years of education: Not on file  . Highest education level: Not on file  Occupational History  . Not on file  Social Needs  . Financial resource strain: Not on file  . Food insecurity:    Worry: Not on file    Inability: Not on file  . Transportation needs:    Medical: Not on file    Non-medical: Not on file  Tobacco Use  . Smoking status: Current Some Day Smoker    Years: 40.00    Types: Cigarettes  . Smokeless tobacco: Never Used  Substance and Sexual Activity  . Alcohol use: No  . Drug use: No  . Sexual activity: Never  Lifestyle  . Physical activity:    Days per week: Not on file    Minutes per session: Not on file  . Stress: Not on file  Relationships  . Social connections:    Talks on phone: Not on file    Gets together: Not on file    Attends religious service: Not on file    Active member of  club or organization: Not on file    Attends meetings of clubs or organizations: Not on file    Relationship status: Not on file  . Intimate partner violence:    Fear of current or ex partner: Not on file    Emotionally abused: Not on file    Physically abused: Not on file    Forced sexual activity: Not on file  Other Topics Concern  . Not on file  Social History Narrative  . Not on file    Outpatient Encounter Medications as of 11/09/2018  Medication Sig  . bisacodyl (DULCOLAX) 5 MG EC tablet Take 2 tablets (10 mg total) by mouth daily.  Marland Kitchen denosumab (PROLIA) 60 MG/ML SOLN injection Inject 60 mg into the skin every 6 (six)  months. Administer in upper arm, thigh, or abdomen  . doxycycline (VIBRA-TABS) 100 MG tablet Take 1 tablet (100 mg total) by mouth 2 (two) times daily for 7 days. 1 po bid  . Multiple Vitamins-Minerals (MULTIVITAMIN & MINERAL PO) Take 1 tablet by mouth daily.   . rosuvastatin (CRESTOR) 20 MG tablet TAKE 1 TABLET BY MOUTH EVERY DAY IN THE EVENING (Patient taking differently: Take 20 mg by mouth every evening. )   No facility-administered encounter medications on file as of 11/09/2018.     Allergies  Allergen Reactions  . Alendronate     espophagitis / GI pain / unsteady gait    Review of Systems  Constitutional: Negative for chills, diaphoresis, fatigue and fever.  Respiratory: Negative for cough and shortness of breath.   Cardiovascular: Positive for leg swelling (slight left lower leg). Negative for chest pain and palpitations.  Skin: Positive for wound.  Neurological: Negative for dizziness, syncope, weakness, light-headedness and headaches.  Psychiatric/Behavioral: Negative for confusion.  All other systems reviewed and are negative.        Observations/Objective: No vital signs or physical exam, this was a telephone or virtual health encounter.  Pt alert and oriented, answers all questions appropriately, and able to speak in full sentences.    Assessment and Plan: [name] was seen today for cellulitis.  Diagnoses and all orders for this visit:  Cellulitis of leg, left Complete course of antibiotics. Report any new or worsening symptoms.     Follow Up Instructions: Return if symptoms worsen or fail to improve.    I discussed the assessment and treatment plan with the patient. The patient was provided an opportunity to ask questions and all were answered. The patient agreed with the plan and demonstrated an understanding of the instructions.   The patient was advised to call back or seek an in-person evaluation if the symptoms worsen or if the condition fails to improve  as anticipated.  The above assessment and management plan was discussed with the patient. The patient verbalized understanding of and has agreed to the management plan. Patient is aware to call the clinic if symptoms persist or worsen. Patient is aware when to return to the clinic for a follow-up visit. Patient educated on when it is appropriate to go to the emergency department.    I provided 15 minutes of non-face-to-face time during this encounter. The call started at 0905. The call ended at 0920. The other time was used for coordination of care.    Kari Baars, FNP-C [facility] [address] [place], [place] [address] [phone]

## 2018-11-14 DIAGNOSIS — J449 Chronic obstructive pulmonary disease, unspecified: Secondary | ICD-10-CM | POA: Diagnosis not present

## 2018-11-14 DIAGNOSIS — S72141D Displaced intertrochanteric fracture of right femur, subsequent encounter for closed fracture with routine healing: Secondary | ICD-10-CM | POA: Diagnosis not present

## 2018-11-14 DIAGNOSIS — D509 Iron deficiency anemia, unspecified: Secondary | ICD-10-CM | POA: Diagnosis not present

## 2018-11-14 DIAGNOSIS — E785 Hyperlipidemia, unspecified: Secondary | ICD-10-CM | POA: Diagnosis not present

## 2018-11-14 DIAGNOSIS — M170 Bilateral primary osteoarthritis of knee: Secondary | ICD-10-CM | POA: Diagnosis not present

## 2018-11-16 DIAGNOSIS — S72141D Displaced intertrochanteric fracture of right femur, subsequent encounter for closed fracture with routine healing: Secondary | ICD-10-CM | POA: Diagnosis not present

## 2019-02-18 ENCOUNTER — Ambulatory Visit: Payer: Medicare Other | Admitting: Family Medicine

## 2019-02-26 ENCOUNTER — Encounter: Payer: Self-pay | Admitting: Family Medicine

## 2019-05-01 ENCOUNTER — Other Ambulatory Visit: Payer: Self-pay

## 2019-05-02 ENCOUNTER — Other Ambulatory Visit: Payer: Self-pay

## 2019-05-02 ENCOUNTER — Ambulatory Visit (INDEPENDENT_AMBULATORY_CARE_PROVIDER_SITE_OTHER): Payer: Medicare Other

## 2019-05-02 DIAGNOSIS — Z23 Encounter for immunization: Secondary | ICD-10-CM | POA: Diagnosis not present

## 2019-05-10 ENCOUNTER — Other Ambulatory Visit: Payer: Self-pay | Admitting: Family Medicine

## 2019-05-15 ENCOUNTER — Telehealth: Payer: Self-pay

## 2019-05-15 ENCOUNTER — Ambulatory Visit (INDEPENDENT_AMBULATORY_CARE_PROVIDER_SITE_OTHER): Payer: Medicare Other | Admitting: *Deleted

## 2019-05-15 ENCOUNTER — Other Ambulatory Visit: Payer: Self-pay

## 2019-05-15 DIAGNOSIS — Z0000 Encounter for general adult medical examination without abnormal findings: Secondary | ICD-10-CM | POA: Diagnosis not present

## 2019-05-15 NOTE — Progress Notes (Signed)
MEDICARE ANNUAL WELLNESS VISIT  05/15/2019  Telephone Visit Disclaimer This Medicare AWV was conducted by telephone due to national recommendations for restrictions regarding the COVID-19 Pandemic (e.g. social distancing).  I verified, using two identifiers, that I am speaking with Eileen EthMonna R Ryback or their authorized healthcare agent. I discussed the limitations, risks, security, and privacy concerns of performing an evaluation and management service by telephone and the potential availability of an in-person appointment in the future. The patient expressed understanding and agreed to proceed.   Subjective:  Eileen Kidd is a 78 y.o. female patient of Dettinger, Elige RadonJoshua A, MD who had a Medicare Annual Wellness Visit today via telephone. Nicanor BakeMonna is Retired and lives alone. she has 3 children. she reports that she is socially active and does interact with friends/family regularly.Her daughter lives in [place] but calls and speaks with her everyday. Her 2 sons do call frequently and come see her on occasion. she is minimally physically active and enjoys doing crossword puzzles, shopping and spending time with her family.  Patient Care Team: Dettinger, Elige RadonJoshua A, MD as PCP - General (Family Medicine)  Advanced Directives 05/15/2019 08/19/2018 08/18/2018 10/11/2017 07/28/2017 09/01/2016 04/14/2015  Does Patient Have a Medical Advance Directive? Yes No No Yes No Yes No  Type of Estate agentAdvance Directive Healthcare Power of WindomAttorney;Living will - - Healthcare Power of KathleenAttorney;Living will - Healthcare Power of [place]an[place]Attorney;Living will -  Does patient want to make changes to medical advance directive? No - Patient declined - - - - No - Patient declined -  Copy of Healthcare Power of Attorney in Chart? No - copy requested - - No - copy requested - No - copy requested -  Would patient like information on creating a medical advance directive? - No - Patient declined - - No - Patient declined - Yes - Educational materials  given    Hospital Utilization Over the Past 12 Months: # of hospitalizations or ER visits: 1 # of surgeries: 1  Review of Systems    Patient reports that her overall health is worse compared to last year.  History obtained from chart review  Patient Reported Readings (BP, Pulse, CBG, Weight, etc) none  Pain Assessment Pain : No/denies pain     Current Medications & Allergies (verified) Allergies as of 05/15/2019      Reactions   Alendronate    espophagitis / GI pain / unsteady gait      Medication List       Accurate as of May 15, 2019 10:04 AM. If you have any questions, ask your nurse or doctor.        STOP taking these medications   bisacodyl 5 MG EC tablet Commonly known as: DULCOLAX     TAKE these medications   acetaminophen 500 MG tablet Commonly known as: TYLENOL Take 500 mg by mouth every 6 (six) hours as needed.   denosumab 60 MG/ML Soln injection Commonly known as: PROLIA Inject 60 mg into the skin every 6 (six) months. Administer in upper arm, thigh, or abdomen   MULTIVITAMIN & MINERAL PO Take 1 tablet by mouth daily.   rosuvastatin 20 MG tablet Commonly known as: CRESTOR TAKE 1 TABLET BY MOUTH EVERY DAY IN THE EVENING       History (reviewed): Past Medical History:  Diagnosis Date  . Arthritis    left knee  . Endometriosis   . History of fractured kneecap    LEFT  . Hyperlipidemia   . Osteoporosis  Past Surgical History:  Procedure Laterality Date  . ABDOMINAL HYSTERECTOMY Bilateral   . FEMUR IM NAIL Right 08/18/2018   Procedure: INTRAMEDULLARY (IM) FEMORAL NAILING;  Surgeon: Samson Frederic, MD;  Location: [facility];  Service: Orthopedics;  Laterality: Right;  . SPINE SURGERY     L5-S1 removal   Family History  Problem Relation Age of Onset  . Heart attack Mother   . Arthritis Mother   . Heart attack Father   . Diabetes Brother   . Stroke Brother   . Breast cancer Daughter    Social History   Socioeconomic History   . Marital status: Widowed    Spouse name: Not on file  . Number of children: 3  . Years of education: 65  . Highest education level: High school graduate  Occupational History  . Occupation: retired  Engineer, production  . Financial resource strain: Not hard at all  . Food insecurity    Worry: Never true    Inability: Never true  . Transportation needs    Medical: No    Non-medical: No  Tobacco Use  . Smoking status: Former Smoker    Years: 40.00    Types: Cigarettes    Quit date: 05/14/2018    Years since quitting: 1.0  . Smokeless tobacco: Never Used  Substance and Sexual Activity  . Alcohol use: No  . Drug use: No  . Sexual activity: Not Currently    Birth control/protection: Surgical  Lifestyle  . Physical activity    Days per week: 7 days    Minutes per session: 10 min  . Stress: Not at all  Relationships  . Social connections    Talks on phone: More than three times a week    Gets together: More than three times a week    Attends religious service: Never    Active member of club or organization: No    Attends meetings of clubs or organizations: Never    Relationship status: Widowed  Other Topics Concern  . Not on file  Social History Narrative  . Not on file    Activities of Daily Living In your present state of health, do you have any difficulty performing the following activities: 05/15/2019 08/19/2018  Hearing? N N  Vision? N N  Comment wears glasses-gets yearly eye exam -  Difficulty concentrating or making decisions? Y Y  Comment pt states she sometimes forgets things but they are so minor she can't remember what she forgets-states she remembers to eat and take her medications -  Walking or climbing stairs? Y N  Comment pt has to use a walker at all times, she doesn't use the stairs anymore -  Dressing or bathing? N Y  Doing errands, shopping? Y N  Comment her friends and neighbors and caregiver takes her to all appointments and to do errands-she doesn't  drive anymore -  Quarry manager and eating ? N -  Using the Toilet? N -  In the past six months, have you accidently leaked urine? N -  Do you have problems with loss of bowel control? N -  Managing your Medications? N -  Managing your Finances? N -  Housekeeping or managing your Housekeeping? Y -  Comment her caregiver and daughter helps with the housekeeping -  Some recent data might be hidden    Patient Education/ Literacy How often do you need to have someone help you when you read instructions, pamphlets, or other written materials from your doctor or  pharmacy?: 1 - Never What is the last grade level you completed in school?: 12th  Exercise Current Exercise Habits: Home exercise routine, Type of exercise: Other - see comments(does chair exercises), Time (Minutes): 10, Frequency (Times/Week): 7, Weekly Exercise (Minutes/Week): 70, Intensity: Mild, Exercise limited by: orthopedic condition(s)  Diet Patient reports consuming 3 meals a day and 2 snack(s) a day Patient reports that her primary diet is: Regular Patient reports that she does have regular access to food.   Depression Screen PHQ 2/9 Scores 05/15/2019 11/09/2018 11/02/2018 02/15/2018 01/30/2018 10/11/2017 02/14/2017  PHQ - 2 Score 0 0 0 0 0 0 0     Fall Risk Fall Risk  05/15/2019 02/15/2018 01/30/2018 10/11/2017 02/14/2017  Falls in the past year? 1 Yes Yes Yes No  Number falls in past yr: 0 2 or more 1 1 -  Injury with Fall? 1 No No No -  Comment - - - - -  Risk for fall due to : Impaired balance/gait;Impaired mobility - - History of fall(s) -  Follow up Falls prevention discussed - - Falls evaluation completed;Education provided;Falls prevention discussed -  Comment Use your walker at all times when you are walking, get rid of all throw rugs, adequate lighting in the walkways and grab bars in the bathroom - - - -     Objective:  Eileen Eth seemed alert and oriented and she participated appropriately during our telephone  visit.  Blood Pressure Weight BMI  BP Readings from Last 3 Encounters:  08/22/18 (!) 131/52  02/15/18 118/71  01/30/18 (!) 141/81   Wt Readings from Last 3 Encounters:  08/18/18 96 lb (43.5 kg)  02/15/18 103 lb (46.7 kg)  01/30/18 104 lb 2 oz (47.2 kg)   BMI Readings from Last 1 Encounters:  08/18/18 17.56 kg/m    *Unable to obtain current vital signs, weight, and BMI due to telephone visit type  Hearing/Vision  . Lowen did not seem to have difficulty with hearing/understanding during the telephone conversation . Reports that she has not had a formal eye exam by an eye care professional within the past year . Reports that she has not had a formal hearing evaluation within the past year *Unable to fully assess hearing and vision during telephone visit type  Cognitive Function: 6CIT Screen 05/15/2019  What Year? 0 points  What month? 0 points  What time? 0 points  Count back from 20 4 points  Months in reverse 4 points  Repeat phrase 10 points  Total Score 18   (Normal:0-7, Significant for Dysfunction: >8)  Normal Cognitive Function Screening: No: pt scored 18 on the screening and seemed confused during the visit. Pt kept saying she couldn't think this early in the morning and was very repetitive.She could not remember falling this year and states she had not been to the hospital this year although she fell and broke her hip and had to have surgery 07/2018 but when I asked her she said it was years ago. I am concerned about cognitive decline as last year during her AWV she scored fine on the MMSE in office. Tried to schedule a follow up appointment with PCP to repeat in person and to evaluate but pt said she didn't need one. Pt doesn't drive and depends on neighbors and has a friend that usually sits with her during the day but her friend recently had a fall and couldn't be there today. Will discuss with Dr Dettinger her PCP to see if we need  to do anything further.   Immunization  & Health Maintenance Record Immunization History  Administered Date(s) Administered  . Fluad Quad(high Dose 65+) 05/02/2019  . Influenza Split 04/03/2013  . Influenza, High Dose Seasonal PF 04/18/2014, 08/02/2015, 04/24/2017, 04/06/2018  . Influenza,inj,Quad PF,6+ Mos 04/14/2015  . Pneumococcal Conjugate-13 10/11/2017  . Tdap 09/01/2016    Health Maintenance  Topic Date Due  . PNA vac Low Risk Adult (2 of 2 - PPSV23) 10/12/2018  . DEXA SCAN  10/12/2019  . TETANUS/TDAP  09/02/2026  . INFLUENZA VACCINE  Completed       Assessment  This is a routine wellness examination for Linton Rump.  Health Maintenance: Due or Overdue Health Maintenance Due  Topic Date Due  . PNA vac Low Risk Adult (2 of 2 - PPSV23) 10/12/2018    Linton Rump needs a referral for Community Assistance: Care Management:   Will discuss with PCP Social Work:    Will discuss with PCP Prescription Assistance:  no Nutrition/Diabetes Education:  no   Plan:  Personalized Goals Goals Addressed            This Visit's Progress   . DIET - INCREASE WATER INTAKE       Try to drink 6-8 glasses of water daily      Personalized Health Maintenance & Screening Recommendations  Pneumococcal vaccine  Shingles vaccine  Lung Cancer Screening Recommended: no (Low Dose CT Chest recommended if Age 66-80 years, 30 pack-year currently smoking OR have quit w/in past 15 years) Hepatitis C Screening recommended: no HIV Screening recommended: no  Advanced Directives: Written information was not prepared per patient's request.  Referrals & Orders No orders of the defined types were placed in this encounter.   Follow-up Plan . Follow-up with Dettinger, Fransisca Kaufmann, MD as planned . Repeat MMSE/6CIT screening . Consider Pneumovax 23 and Shingles vaccine at your next visit with your PCP   I have personally reviewed and noted the following in the patient's chart:   . Medical and social history . Use of alcohol,  tobacco or illicit drugs  . Current medications and supplements . Functional ability and status . Nutritional status . Physical activity . Advanced directives . List of other physicians . Hospitalizations, surgeries, and ER visits in previous 12 months . Vitals . Screenings to include cognitive, depression, and falls . Referrals and appointments  In addition, I have reviewed and discussed with Linton Rump certain preventive protocols, quality metrics, and best practice recommendations. A written personalized care plan for preventive services as well as general preventive health recommendations is available and can be mailed to the patient at her request.      Milas Hock, LPN  [id number]

## 2019-05-15 NOTE — Telephone Encounter (Signed)
Called patient about scheduling an appointment for memory.  Patient states she does not drive and the lady who brings her has COVID.  Patient states she will call back to schedule apt.

## 2019-05-15 NOTE — Telephone Encounter (Signed)
-----   Message from Worthy Rancher, MD sent at 05/15/2019 11:40 AM EST ----- Can you reach out to the family or the patient and see about possibly getting an appointment for her in the near future to discuss memory and worsening and discuss any concerns they may or may not have.  We can also see about resources and what she may or may not need based on her worsening memory. Caryl Pina, MD [facility] 05/15/2019, 11:40 AM

## 2019-05-15 NOTE — Patient Instructions (Signed)
Preventive Care 38 Years and Older, Female Preventive care refers to lifestyle choices and visits with your health care provider that can promote health and wellness. This includes:  A yearly physical exam. This is also called an annual well check.  Regular dental and eye exams.  Immunizations.  Screening for certain conditions.  Healthy lifestyle choices, such as diet and exercise. What can I expect for my preventive care visit? Physical exam Your health care provider will check:  Height and weight. These may be used to calculate body mass index (BMI), which is a measurement that tells if you are at a healthy weight.  Heart rate and blood pressure.  Your skin for abnormal spots. Counseling Your health care provider may ask you questions about:  Alcohol, tobacco, and drug use.  Emotional well-being.  Home and relationship well-being.  Sexual activity.  Eating habits.  History of falls.  Memory and ability to understand (cognition).  Work and work Statistician.  Pregnancy and menstrual history. What immunizations do I need?  Influenza (flu) vaccine  This is recommended every year. Tetanus, diphtheria, and pertussis (Tdap) vaccine  You may need a Td booster every 10 years. Varicella (chickenpox) vaccine  You may need this vaccine if you have not already been vaccinated. Zoster (shingles) vaccine  You may need this after age 80. Pneumococcal conjugate (PCV13) vaccine  One dose is recommended after age 80. Pneumococcal polysaccharide (PPSV23) vaccine  One dose is recommended after age 72. Measles, mumps, and rubella (MMR) vaccine  You may need at least one dose of MMR if you were born in 1957 or later. You may also need a second dose. Meningococcal conjugate (MenACWY) vaccine  You may need this if you have certain conditions. Hepatitis A vaccine  You may need this if you have certain conditions or if you travel or work in places where you may be exposed  to hepatitis A. Hepatitis B vaccine  You may need this if you have certain conditions or if you travel or work in places where you may be exposed to hepatitis B. Haemophilus influenzae type b (Hib) vaccine  You may need this if you have certain conditions. You may receive vaccines as individual doses or as more than one vaccine together in one shot (combination vaccines). Talk with your health care provider about the risks and benefits of combination vaccines. What tests do I need? Blood tests  Lipid and cholesterol levels. These may be checked every 5 years, or more frequently depending on your overall health.  Hepatitis C test.  Hepatitis B test. Screening  Lung cancer screening. You may have this screening every year starting at age 39 if you have a 30-pack-year history of smoking and currently smoke or have quit within the past 15 years.  Colorectal cancer screening. All adults should have this screening starting at age 36 and continuing until age 15. Your health care provider may recommend screening at age 23 if you are at increased risk. You will have tests every 1-10 years, depending on your results and the type of screening test.  Diabetes screening. This is done by checking your blood sugar (glucose) after you have not eaten for a while (fasting). You may have this done every 1-3 years.  Mammogram. This may be done every 1-2 years. Talk with your health care provider about how often you should have regular mammograms.  BRCA-related cancer screening. This may be done if you have a family history of breast, ovarian, tubal, or peritoneal cancers.  Other tests  Sexually transmitted disease (STD) testing.  Bone density scan. This is done to screen for osteoporosis. You may have this done starting at age 76. Follow these instructions at home: Eating and drinking  Eat a diet that includes fresh fruits and vegetables, whole grains, lean protein, and low-fat dairy products. Limit  your intake of foods with high amounts of sugar, saturated fats, and salt.  Take vitamin and mineral supplements as recommended by your health care provider.  Do not drink alcohol if your health care provider tells you not to drink.  If you drink alcohol: ? Limit how much you have to 0-1 drink a day. ? Be aware of how much alcohol is in your drink. In the [place] one drink equals one 12 oz bottle of beer (355 mL), one 5 oz glass of wine (148 mL), or one 1 oz glass of hard liquor (44 mL). Lifestyle  Take daily care of your teeth and gums.  Stay active. Exercise for at least 30 minutes on 5 or more days each week.  Do not use any products that contain nicotine or tobacco, such as cigarettes, e-cigarettes, and chewing tobacco. If you need help quitting, ask your health care provider.  If you are sexually active, practice safe sex. Use a condom or other form of protection in order to prevent STIs (sexually transmitted infections).  Talk with your health care provider about taking a low-dose aspirin or statin. What's next?  Go to your health care provider once a year for a well check visit.  Ask your health care provider how often you should have your eyes and teeth checked.  Stay up to date on all vaccines. This information is not intended to replace advice given to you by your health care provider. Make sure you discuss any questions you have with your health care provider. Document Released: 07/10/2015 Document Revised: 06/07/2018 Document Reviewed: 06/07/2018 Elsevier Patient Education  2020 [employer].

## 2019-08-12 ENCOUNTER — Telehealth: Payer: Self-pay | Admitting: Family Medicine

## 2019-08-12 NOTE — Chronic Care Management (AMB) (Signed)
  Chronic Care Management   Note  08/12/2019 Name: Eileen Kidd MRN: [id number] DOB: 1940-10-11  Eileen Kidd is a 79 y.o. year old female who is a primary care patient of Dettinger, Fransisca Kaufmann, MD. I reached out to Linton Rump by phone today in response to a referral sent by Ms. Yulonda R Bouza's health plan.     Ms. Rossano was given information about Chronic Care Management services today including:  1. CCM service includes personalized support from designated clinical staff supervised by her physician, including individualized plan of care and coordination with other care providers 2. 24/7 contact phone numbers for assistance for urgent and routine care needs. 3. Service will only be billed when office clinical staff spend 20 minutes or more in a month to coordinate care. 4. Only one practitioner may furnish and bill the service in a calendar month. 5. The patient may stop CCM services at any time (effective at the end of the month) by phone call to the office staff. 6. The patient will be responsible for cost sharing (co-pay) of up to 20% of the service fee (after annual deductible is met).  Patient agreed to services and verbal consent obtained.   Follow up plan: Telephone appointment with care management team member scheduled for:09/06/2019  Noreene Larsson, [address] [place], [place], [place] [address] Direct Dial: [phone] [email or web address] Website: [email or web address]

## 2019-09-06 ENCOUNTER — Ambulatory Visit (INDEPENDENT_AMBULATORY_CARE_PROVIDER_SITE_OTHER): Payer: Medicare Other | Admitting: *Deleted

## 2019-09-06 DIAGNOSIS — E785 Hyperlipidemia, unspecified: Secondary | ICD-10-CM | POA: Diagnosis not present

## 2019-09-06 DIAGNOSIS — M810 Age-related osteoporosis without current pathological fracture: Secondary | ICD-10-CM | POA: Diagnosis not present

## 2019-09-06 NOTE — Patient Instructions (Signed)
Visit Information  Goals Addressed            This Visit's Progress   . Chronic Disease Management Needs       CARE PLAN ENTRY (see longtitudinal plan of care for additional care plan information)  Current Barriers:  . Chronic Disease Management support, education, and care coordination needs related to Osteoporosis, arthritis, hyperlipidemia  Clinical Goal(s) related to Osteoporosis, arthritis, hyperlipidemia:  Over the next 60 days, patient will:  . Work with the care management team to address educational, disease management, and care coordination needs  . Call provider office for new or worsened signs and symptoms  . Call care management team with questions or concerns . Verbalize basic understanding of patient centered plan of care established today  Interventions related to Osteoporosis, arthritis, hyperlipidemia:  . Evaluation of current treatment plans and patient's adherence to plan as established by provider . Assessed patient understanding of disease states . Assessed patient's education and care coordination needs . Provided disease specific education to patient  . Collaborated with appropriate clinical care team members regarding patient needs  Patient Self Care Activities related to Osteoporosis, arthritis, hyperlipidemia:  . Patient is unable to independently self-manage chronic health conditions  Initial goal documentation      . Fall Prevention       Current Barriers:  . Problem with mobility r/t prior hip fracture . Balance  Nurse Case Manager Clinical Goal(s):  Marland Kitchen Over the next 90 days, patient will verbalize using fall risk reduction strategies discussed . Over the next 90 days, patient will not experience additional falls  Interventions:  . Provided verbal education re: Potential causes of falls and Fall prevention strategies . Assessed for s/s of orthostatic hypotension . Assessed for falls since last encounter. . Assessed patients knowledge of  fall risk prevention secondary to previously provided education. . Encouraged patient to stand up and change positions slowly . Encouraged patient to keep walkways clear and easily passable . Encouraged patient to use cane or walker appropriately . Discussed safety measures in her home. She lives in a handicap accessible apartment and has railings in her bathroom and a shower chair.   Patient Self Care Activities:  . Performs ADLs independently. . Does not drive but has family that assists her  Initial goal documentation     . Hyperlipidemia       CARE PLAN ENTRY (see longtitudinal plan of care for additional care plan information)  Current Barriers:  . Chronic Disease Management support and education needs related to hyperlipidemia  Nurse Case Manager Clinical Goal(s):  Marland Kitchen Over the next 60 days, patient will schedule appointment with Dr Dettinger to follow-up on chronic medical conditions and to have labwork to check cholesterol and liver functions  Interventions:  . Evaluation of current treatment plan related to hyperlipidemia and patient's adherence to plan as established by provider. . Advised patient to schedule an appointment with Dr Dettinger for lipid and liver panel. Patient would like to consult with daughter before scheduling.  . Reviewed medications with patient and discussed Crestor . Discussed plans with patient for ongoing care management follow up and provided patient with direct contact information for care management team . Chart reviewed including office notes and lab results. She is overdue for lipid/liver panel.  . Discussed diet. She is not following any special diet  Patient Self Care Activities:  . Performs ADL's independently . Performs IADL's independently . Unable to independently drive  Initial goal documentation     .  Osteoporosis       CARE PLAN ENTRY (see longtitudinal plan of care for additional care plan information)  Current Barriers:   . Chronic Disease Management support and education needs related to osteoporosis  Nurse Case Manager Clinical Goal(s):  Marland Kitchen Over the next 60 days, patient will work with PCP to address needs related to osteoporosis management . Over the next 6 months, patient will not experience any osteoporotic fractures  Interventions:  . Chart reviewed including office notes, labs, and radiology reports o No current dexa scan visible  o Hip fracture 08/22/2018 . Medications reviewed and discussed Prolia with patient. States that she hasn't had that in a while. She is taking a multivitamin but no separate calcium or vit D.  . Discussed activity level. She does does leg lifts and squats daily and walks around her apartment.  . Discussed fall precautions/prevention. Using cane and walker as needed.  . Recommended that she talk with PCP about osteoporosis management at her next appointment.  . RN will collaborate with PCP regarding osteoporosis management and if she needs to be on any medication . Provided with CCM contact information and encouraged to reach out as needed  Patient Self Care Activities:  . Performs ADL's independently . Performs IADL's independently . Unable to independently drive  Initial goal documentation       Ms. Chilcott was given information about Chronic Care Management services today including:  1. CCM service includes personalized support from designated clinical staff supervised by her physician, including individualized plan of care and coordination with other care providers 2. 24/7 contact phone numbers for assistance for urgent and routine care needs. 3. Service will only be billed when office clinical staff spend 20 minutes or more in a month to coordinate care. 4. Only one practitioner may furnish and bill the service in a calendar month. 5. The patient may stop CCM services at any time (effective at the end of the month) by phone call to the office staff. 6. The patient  will be responsible for cost sharing (co-pay) of up to 20% of the service fee (after annual deductible is met).  Patient agreed to services and verbal consent obtained.    The patient verbalized understanding of instructions provided today and declined a print copy of patient instruction materials.   Follow-up Plan The care management team will reach out to the patient again over the next 90 days.   Chong Sicilian, BSN, RN-BC Embedded Chronic Care Manager [facility] [place] [facility] Direct Dial: [phone]

## 2019-09-06 NOTE — Chronic Care Management (AMB) (Addendum)
Chronic Care Management   Initial Visit Note  09/06/2019 Name: Eileen Kidd MRN: [id number] DOB: 04-Dec-1940  Referred by: Dettinger, Elige Radon, MD Reason for referral : Chronic Care Management (RN Initial Visit)   Eileen Kidd is a 79 y.o. year old female who is a primary care patient of Dettinger, Elige Radon, MD. The CCM team was consulted for assistance with chronic disease management and care coordination needs related to osteoporosis, arthritis, hyperlipidemia.  Review of patient status, including review of consultants reports, relevant laboratory and other test results, and collaboration with appropriate care team members and the patient's provider was performed as part of comprehensive patient evaluation and provision of chronic care management services.    SDOH (Social Determinants of Health) assessments performed: Yes See Care Plan activities for detailed interventions related to SDOH)    Subjective: I spoke with Eileen Kidd by telephone today. She lives alone in a ground-level handicap accessible apartment. She is very independent but has 3 children that check in on her daily and are available to help as needed. She does not drive but her family and friends help her get to appointments and run errands. She does not have any problems with access to food, medication, or transportation. She states, "I think I'm doing pretty good for my age." She is agreeable to work with the CCM team and stated she would reach out if necessary but she does not have any concerns today.   Objective:  Outpatient Encounter Medications as of 09/06/2019  Medication Sig  . acetaminophen (TYLENOL) 500 MG tablet Take 500 mg by mouth every 6 (six) hours as needed.  . Multiple Vitamins-Minerals (MULTIVITAMIN & MINERAL PO) Take 1 tablet by mouth daily.   . rosuvastatin (CRESTOR) 20 MG tablet TAKE 1 TABLET BY MOUTH EVERY DAY IN THE EVENING  . denosumab (PROLIA) 60 MG/ML SOLN injection Inject 60 mg into the skin  every 6 (six) months. Administer in upper arm, thigh, or abdomen (Patient not taking: Reported on 09/06/2019)   No facility-administered encounter medications on file as of 09/06/2019.     Lab Results  Component Value Date   CHOL 172 02/15/2018   HDL 80 02/15/2018   LDLCALC 81 02/15/2018   TRIG 53 02/15/2018   CHOLHDL 2.2 02/15/2018   Fall Risk  09/06/2019 05/15/2019 02/15/2018 01/30/2018 10/11/2017  Falls in the past year? 0 1 Yes Yes Yes  Number falls in past yr: 0 0 2 or more 1 1  Injury with Fall? 0 1 No No No  Comment - - - - -  Risk for fall due to : History of fall(s);Impaired balance/gait Impaired balance/gait;Impaired mobility - - History of fall(s)  Follow up Falls prevention discussed Falls prevention discussed - - Falls evaluation completed;Education provided;Falls prevention discussed  Comment - Use your walker at all times when you are walking, get rid of all throw rugs, adequate lighting in the walkways and grab bars in the bathroom - - -    RN Assessment & Care Plan   . Chronic Disease Management Needs       CARE PLAN ENTRY (see longtitudinal plan of care for additional care plan information)  Current Barriers:  . Chronic Disease Management support, education, and care coordination needs related to Osteoporosis, arthritis, hyperlipidemia  Clinical Goal(s) related to Osteoporosis, arthritis, hyperlipidemia:  Over the next 60 days, patient will:  . Work with the care management team to address educational, disease management, and care coordination needs  . Call provider office  for new or worsened signs and symptoms  . Call care management team with questions or concerns . Verbalize basic understanding of patient centered plan of care established today  Interventions related to Osteoporosis, arthritis, hyperlipidemia:  . Evaluation of current treatment plans and patient's adherence to plan as established by provider . Assessed patient understanding of disease states .  Assessed patient's education and care coordination needs . Provided disease specific education to patient  . Collaborated with appropriate clinical care team members regarding patient needs  Patient Self Care Activities related to Osteoporosis, arthritis, hyperlipidemia:  . Patient is unable to independently self-manage chronic health conditions  Initial goal documentation      . Fall Prevention       Current Barriers:  . Problem with mobility r/t prior hip fracture . Balance  Nurse Case Manager Clinical Goal(s):  Marland Kitchen Over the next 90 days, patient will verbalize using fall risk reduction strategies discussed . Over the next 90 days, patient will not experience additional falls  Interventions:  . Provided verbal education re: Potential causes of falls and Fall prevention strategies . Assessed for s/s of orthostatic hypotension . Assessed for falls since last encounter. . Assessed patients knowledge of fall risk prevention secondary to previously provided education. . Encouraged patient to stand up and change positions slowly . Encouraged patient to keep walkways clear and easily passable . Encouraged patient to use cane or walker appropriately . Discussed safety measures in her home. She lives in a handicap accessible apartment and has railings in her bathroom and a shower chair.   Patient Self Care Activities:  . Performs ADLs independently. . Does not drive but has family that assists her  Initial goal documentation     . Hyperlipidemia       CARE PLAN ENTRY (see longtitudinal plan of care for additional care plan information)  Current Barriers:  . Chronic Disease Management support and education needs related to hyperlipidemia  Nurse Case Manager Clinical Goal(s):  Marland Kitchen Over the next 60 days, patient will schedule appointment with Dr Dettinger to follow-up on chronic medical conditions and to have labwork to check cholesterol and liver functions  Interventions:  .  Evaluation of current treatment plan related to hyperlipidemia and patient's adherence to plan as established by provider. . Advised patient to schedule an appointment with Dr Dettinger for lipid and liver panel. Patient would like to consult with daughter before scheduling.  . Reviewed medications with patient and discussed Crestor . Discussed plans with patient for ongoing care management follow up and provided patient with direct contact information for care management team . Chart reviewed including office notes and lab results. She is overdue for lipid/liver panel.  . Discussed diet. She is not following any special diet  Patient Self Care Activities:  . Performs ADL's independently . Performs IADL's independently . Unable to independently drive  Initial goal documentation     . Osteoporosis       CARE PLAN ENTRY (see longtitudinal plan of care for additional care plan information)  Current Barriers:  . Chronic Disease Management support and education needs related to osteoporosis  Nurse Case Manager Clinical Goal(s):  Marland Kitchen Over the next 60 days, patient will work with PCP to address needs related to osteoporosis management . Over the next 6 months, patient will not experience any osteoporotic fractures  Interventions:  . Chart reviewed including office notes, labs, and radiology reports o No current dexa scan visible  o Hip fracture 08/22/2018 . Medications  reviewed and discussed Prolia with patient. States that she hasn't had that in a while. She is taking a multivitamin but no separate calcium or vit D.  . Discussed activity level. She does does leg lifts and squats daily and walks around her apartment.  . Discussed fall precautions/prevention. Using cane and walker as needed.  . Recommended that she talk with PCP about osteoporosis management at her next appointment.  . RN will collaborate with PCP regarding osteoporosis management and if she needs to be on any medication .  Provided with CCM contact information and encouraged to reach out as needed  Patient Self Care Activities:  . Performs ADL's independently . Performs IADL's independently . Unable to independently drive  Initial goal documentation         Follow-up Plan:   The care management team will reach out to the patient again over the next 90 days.   Chong Sicilian, BSN, RN-BC Embedded Chronic Care Manager [facility] [place] [facility] Direct Dial: [phone]

## 2020-01-09 ENCOUNTER — Telehealth: Payer: Self-pay | Admitting: *Deleted

## 2020-01-09 NOTE — Telephone Encounter (Signed)
[employer] sent over [facility] and other paperwork to be filled out. Pt has not been seen in a year. She will talk with her daughter and call back to set up an appointment

## 2020-01-21 ENCOUNTER — Ambulatory Visit (INDEPENDENT_AMBULATORY_CARE_PROVIDER_SITE_OTHER): Payer: Medicare Other | Admitting: Family

## 2020-01-21 ENCOUNTER — Encounter: Payer: Self-pay | Admitting: Family

## 2020-01-21 ENCOUNTER — Other Ambulatory Visit: Payer: Self-pay

## 2020-01-21 VITALS — BP 128/69 | HR 75 | Temp 97.7°F | Ht 62.0 in | Wt 94.4 lb

## 2020-01-21 DIAGNOSIS — R531 Weakness: Secondary | ICD-10-CM

## 2020-01-21 DIAGNOSIS — R296 Repeated falls: Secondary | ICD-10-CM

## 2020-01-21 DIAGNOSIS — R413 Other amnesia: Secondary | ICD-10-CM | POA: Diagnosis not present

## 2020-01-21 DIAGNOSIS — Z8781 Personal history of (healed) traumatic fracture: Secondary | ICD-10-CM

## 2020-01-21 NOTE — Progress Notes (Signed)
   Subjective:    Patient ID: Eileen Kidd, female    DOB: May 15, 1941, 79 y.o.   MRN: [id number]  Chief Complaint  Patient presents with  . Follow-up    FL2 for [facility]     HPI  Pt presents to the office today for FL2 form to be completed. She is in the process of being admitted to [facility]. She is currently living at home by herself, however, this is becoming increasingly harder. She fell twice in the last month. She uses a rolling walker. She also has increased weakness. She also report changes in her memory.   She fell last year in 07/2018 and broke her right hip and femur.   Review of Systems     Objective:   Physical Exam Vitals reviewed.  Constitutional:      General: She is not in acute distress.    Appearance: She is well-developed.  HENT:     Head: Normocephalic and atraumatic.     Right Ear: Tympanic membrane normal.     Left Ear: Tympanic membrane normal.  Eyes:     Pupils: Pupils are equal, round, and reactive to light.  Neck:     Thyroid: No thyromegaly.  Cardiovascular:     Rate and Rhythm: Normal rate and regular rhythm.     Heart sounds: Normal heart sounds. No murmur heard.   Pulmonary:     Effort: Pulmonary effort is normal. No respiratory distress.     Breath sounds: Normal breath sounds. No wheezing.  Abdominal:     General: Bowel sounds are normal. There is no distension.     Palpations: Abdomen is soft.     Tenderness: There is no abdominal tenderness.  Musculoskeletal:        General: No tenderness. Normal range of motion.     Cervical back: Normal range of motion and neck supple.  Skin:    General: Skin is warm and dry.  Neurological:     Mental Status: She is alert and oriented to person, place, and time.     Cranial Nerves: No cranial nerve deficit.     Motor: Weakness present.     Gait: Gait abnormal.     Deep Tendon Reflexes: Reflexes are normal and symmetric.     Comments: Generalized weakness, using rolling walker    Psychiatric:        Behavior: Behavior normal.        Thought Content: Thought content normal.        Judgment: Judgment normal.      BP 128/69   Pulse 75   Temp 97.7 F (36.5 C) (Temporal)   Ht '5\' 2"'$  (1.575 m)   Wt (!) 94 lb 6.4 oz (42.8 kg)   SpO2 95%   BMI 17.27 kg/m       Assessment & Plan:  Eileen Kidd comes in today with chief complaint of Follow-up (FL2 for [facility] )   Diagnosis and orders addressed:  1. Weakness - CBC with Differential/Platelet - BMP8+EGFR  2. Frequent falls - CBC with Differential/Platelet - BMP8+EGFR  3. Memory change - CBC with Differential/Platelet - BMP8+EGFR  4. History of fracture of right hip - CBC with Differential/Platelet - BMP8+EGFR  5. History of fracture of femur - CBC with Differential/Platelet - BMP8+EGFR - PPD   Labs pending Keep follow up with PCP FL2 form completed and we will fax to [place], [place]

## 2020-01-21 NOTE — Patient Instructions (Signed)

## 2020-01-22 LAB — CBC WITH DIFFERENTIAL/PLATELET
Basophils Absolute: 0.1 10*3/uL (ref 0.0–0.2)
Basos: 1 %
EOS (ABSOLUTE): 0 10*3/uL (ref 0.0–0.4)
Eos: 1 %
Hematocrit: 41.3 % (ref 34.0–46.6)
Hemoglobin: 13.5 g/dL (ref 11.1–15.9)
Immature Grans (Abs): 0 10*3/uL (ref 0.0–0.1)
Immature Granulocytes: 1 %
Lymphocytes Absolute: 1.9 10*3/uL (ref 0.7–3.1)
Lymphs: 23 %
MCH: 31 pg (ref 26.6–33.0)
MCHC: 32.7 g/dL (ref 31.5–35.7)
MCV: 95 fL (ref 79–97)
Monocytes Absolute: 0.7 10*3/uL (ref 0.1–0.9)
Monocytes: 9 %
Neutrophils Absolute: 5.4 10*3/uL (ref 1.4–7.0)
Neutrophils: 65 %
Platelets: 277 10*3/uL (ref 150–450)
RBC: 4.36 x10E6/uL (ref 3.77–5.28)
RDW: 12.5 % (ref 11.7–15.4)
WBC: 8.1 10*3/uL (ref 3.4–10.8)

## 2020-01-22 LAB — BMP8+EGFR
BUN/Creatinine Ratio: 19 (ref 12–28)
BUN: 13 mg/dL (ref 8–27)
CO2: 26 mmol/L (ref 20–29)
Calcium: 10.1 mg/dL (ref 8.7–10.3)
Chloride: 100 mmol/L (ref 96–106)
Creatinine, Ser: 0.69 mg/dL (ref 0.57–1.00)
GFR calc Af Amer: 94 mL/min/{1.73_m2} (ref >59)
GFR calc non Af Amer: 82 mL/min/{1.73_m2} (ref >59)
Glucose: 83 mg/dL (ref 65–99)
Potassium: 4.1 mmol/L (ref 3.5–5.2)
Sodium: 140 mmol/L (ref 134–144)

## 2020-01-23 ENCOUNTER — Other Ambulatory Visit: Payer: Self-pay

## 2020-01-23 ENCOUNTER — Ambulatory Visit: Payer: Medicare Other | Admitting: *Deleted

## 2020-01-23 DIAGNOSIS — Z23 Encounter for immunization: Secondary | ICD-10-CM

## 2020-01-23 LAB — TB SKIN TEST
Induration: 0 mm
TB Skin Test: NEGATIVE

## 2020-01-23 NOTE — Progress Notes (Signed)
PPD negative, letter written and will be faxed to [facility]

## 2020-02-06 ENCOUNTER — Other Ambulatory Visit: Payer: Self-pay

## 2020-02-06 ENCOUNTER — Other Ambulatory Visit: Payer: Medicare Other

## 2020-02-06 DIAGNOSIS — Z20822 Contact with and (suspected) exposure to covid-19: Secondary | ICD-10-CM

## 2020-02-07 LAB — NOVEL CORONAVIRUS, NAA: SARS-CoV-2, NAA: NOT DETECTED

## 2020-02-07 LAB — SARS-COV-2, NAA 2 DAY TAT

## 2020-02-27 ENCOUNTER — Other Ambulatory Visit: Payer: Self-pay | Admitting: Family Medicine

## 2020-02-27 NOTE — Telephone Encounter (Signed)
Patient last seen in 2020. Patient needs to be seen.

## 2020-05-26 DIAGNOSIS — M79676 Pain in unspecified toe(s): Secondary | ICD-10-CM | POA: Diagnosis not present

## 2020-05-26 DIAGNOSIS — B351 Tinea unguium: Secondary | ICD-10-CM | POA: Diagnosis not present

## 2020-08-18 DIAGNOSIS — B351 Tinea unguium: Secondary | ICD-10-CM | POA: Diagnosis not present

## 2020-08-18 DIAGNOSIS — M79676 Pain in unspecified toe(s): Secondary | ICD-10-CM | POA: Diagnosis not present

## 2020-09-04 DIAGNOSIS — U071 COVID-19: Secondary | ICD-10-CM | POA: Diagnosis not present

## 2020-09-30 ENCOUNTER — Encounter: Payer: Self-pay | Admitting: Family Medicine

## 2020-09-30 ENCOUNTER — Other Ambulatory Visit: Payer: Self-pay

## 2020-09-30 ENCOUNTER — Ambulatory Visit (INDEPENDENT_AMBULATORY_CARE_PROVIDER_SITE_OTHER): Payer: Medicare Other | Admitting: Family Medicine

## 2020-09-30 VITALS — BP 129/63 | HR 80 | Temp 98.0°F | Ht 62.0 in | Wt 100.3 lb

## 2020-09-30 DIAGNOSIS — M25432 Effusion, left wrist: Secondary | ICD-10-CM | POA: Diagnosis not present

## 2020-09-30 MED ORDER — NAPROXEN 375 MG PO TABS: 375.0000 mg | ORAL_TABLET | Freq: Two times a day (BID) | ORAL | 0 refills | Status: AC

## 2020-09-30 MED ORDER — CEFTRIAXONE SODIUM 1 G IJ SOLR
1.0000 g | Freq: Once | INTRAMUSCULAR | Status: AC
  Administered 2020-09-30: 1 g via INTRAMUSCULAR

## 2020-09-30 NOTE — Progress Notes (Signed)
Subjective: CC: hand pain/ swelling PCP: Dettinger, Elige Radon, MD Eileen Kidd is a 80 y.o. female presenting to clinic today for:  1.  Left hand swelling Patient is brought by her caregiver at [facility].  She reports that approximately on Monday evening there was some redness, swelling and warmth noted of her left hand/wrist.  Patient denies any preceding injury.  She denies any pain.  She feels that the swelling is getting better as does the caregiver.  No known bug bites or history of gout.  No treatments thus far.   ROS: Per HPI  Allergies  Allergen Reactions  . Alendronate     espophagitis / GI pain / unsteady gait   Past Medical History:  Diagnosis Date  . Arthritis    left knee  . Endometriosis   . History of fractured kneecap    LEFT  . Hyperlipidemia   . Osteoporosis     Current Outpatient Medications:  .  acetaminophen (TYLENOL) 500 MG tablet, Take 500 mg by mouth every 6 (six) hours as needed., Disp: , Rfl:  .  Multiple Vitamins-Minerals (MULTIVITAMIN & MINERAL PO), Take 1 tablet by mouth daily. , Disp: , Rfl:  .  rosuvastatin (CRESTOR) 20 MG tablet, TAKE 1 TABLET BY MOUTH EVERY DAY IN THE EVENING, Disp: 90 tablet, Rfl: 2 Social History   Socioeconomic History  . Marital status: Widowed    Spouse name: Not on file  . Number of children: 3  . Years of education: 35  . Highest education level: High school graduate  Occupational History  . Occupation: retired  Tobacco Use  . Smoking status: Former Smoker    Years: 40.00    Types: Cigarettes    Quit date: 05/14/2018    Years since quitting: 2.3  . Smokeless tobacco: Never Used  Vaping Use  . Vaping Use: Never used  Substance and Sexual Activity  . Alcohol use: No  . Drug use: No  . Sexual activity: Not Currently    Birth control/protection: Surgical  Other Topics Concern  . Not on file  Social History Narrative  . Not on file   Social Determinants of Health   Financial Resource Strain:  Not on file  Food Insecurity: Not on file  Transportation Needs: Not on file  Physical Activity: Not on file  Stress: Not on file  Social Connections: Not on file  Intimate Partner Violence: Not on file   Family History  Problem Relation Age of Onset  . Heart attack Mother   . Arthritis Mother   . Heart attack Father   . Diabetes Brother   . Stroke Brother   . Breast cancer Daughter     Objective: Office vital signs reviewed. BP 129/63   Pulse 80   Temp 98 F (36.7 C) (Temporal)   Ht 5\' 2"  (1.575 m)   Wt 100 lb 4.8 oz (45.5 kg)   SpO2 98%   BMI 18.35 kg/m   Physical Examination:  General: Awake, alert, thin, elderly female. No acute distress MSK: Left wrist with mild soft tissue swelling with associated mild warmth and erythema.  No joint tenderness.  She has fairly full active range of motion of the left wrist and hand.  She does have a fullness over the distal ulna that is nontender.   Assessment/ Plan: 80 y.o. female   Swollen wrist, left - Plan: cefTRIAXone (ROCEPHIN) injection 1 g, naproxen (NAPROSYN) 375 MG tablet  Uncertain etiology.  Given her limited recollection/cognition, history  is difficult.  The swelling seems to be getting better from the report but I did appreciate a difference between the left and right wrist girths.  There is certainly some mild erythema and very mild warmth and therefore she was empirically treated with Rocephin and Naprosyn.  Unsure if this is an early infection versus ulnar bursitis vs gout versus injury that was not recalled.  Ice the affected area.  Follow-up on Monday for reassessment.  Reassessing provider was present for her evaluation.  Patient will follow up sooner if needed  No orders of the defined types were placed in this encounter.  No orders of the defined types were placed in this encounter.    Raliegh Ip, DO [facility] [phone]

## 2020-10-01 ENCOUNTER — Telehealth: Payer: Self-pay | Admitting: *Deleted

## 2020-10-01 NOTE — Telephone Encounter (Signed)
TC on clarification on how long to ice wrist for each time Recommended for 10-20 minutes

## 2020-10-05 ENCOUNTER — Encounter: Payer: Self-pay | Admitting: Family Medicine

## 2020-10-05 ENCOUNTER — Ambulatory Visit: Payer: Medicare Other | Admitting: Nurse Practitioner

## 2020-10-05 ENCOUNTER — Other Ambulatory Visit: Payer: Self-pay

## 2020-10-05 ENCOUNTER — Ambulatory Visit (INDEPENDENT_AMBULATORY_CARE_PROVIDER_SITE_OTHER): Payer: Medicare Other | Admitting: Family Medicine

## 2020-10-05 VITALS — BP 130/63 | HR 88 | Temp 98.4°F | Ht 62.0 in | Wt 100.2 lb

## 2020-10-05 DIAGNOSIS — M25432 Effusion, left wrist: Secondary | ICD-10-CM | POA: Diagnosis not present

## 2020-10-05 NOTE — Progress Notes (Signed)
Established Patient Office Visit  Subjective:  Patient ID: Eileen Kidd, female    DOB: 05/20/1941  Age: 80 y.o. MRN: [id number]  CC:  Chief Complaint  Patient presents with  . Follow-up    Left hand    HPI Eileen Kidd presents for a follow up visit for a swollen left wrist. She is with her caregiver today who helped provide this history. Eileen Kidd was treated on 09/30/20 with a rocephin injection and naproxen for possible cellulitis. She reports that her symptoms have resolved. Denies fever, chills, pain, redness, swelling, or warmth.    Past Medical History:  Diagnosis Date  . Arthritis    left knee  . Endometriosis   . History of fractured kneecap    LEFT  . Hyperlipidemia   . Osteoporosis     Past Surgical History:  Procedure Laterality Date  . ABDOMINAL HYSTERECTOMY Bilateral   . FEMUR IM NAIL Right 08/18/2018   Procedure: INTRAMEDULLARY (IM) FEMORAL NAILING;  Surgeon: Samson Frederic, MD;  Location: [facility];  Service: Orthopedics;  Laterality: Right;  . SPINE SURGERY     L5-S1 removal    Family History  Problem Relation Age of Onset  . Heart attack Mother   . Arthritis Mother   . Heart attack Father   . Diabetes Brother   . Stroke Brother   . Breast cancer Daughter     Social History   Socioeconomic History  . Marital status: Widowed    Spouse name: Not on file  . Number of children: 3  . Years of education: 31  . Highest education level: High school graduate  Occupational History  . Occupation: retired  Tobacco Use  . Smoking status: Former Smoker    Years: 40.00    Types: Cigarettes    Quit date: 05/14/2018    Years since quitting: 2.3  . Smokeless tobacco: Never Used  Vaping Use  . Vaping Use: Never used  Substance and Sexual Activity  . Alcohol use: No  . Drug use: No  . Sexual activity: Not Currently    Birth control/protection: Surgical  Other Topics Concern  . Not on file  Social History Narrative  . Not on file   Social  Determinants of Health   Financial Resource Strain: Not on file  Food Insecurity: Not on file  Transportation Needs: Not on file  Physical Activity: Not on file  Stress: Not on file  Social Connections: Not on file  Intimate Partner Violence: Not on file    Outpatient Medications Prior to Visit  Medication Sig Dispense Refill  . acetaminophen (TYLENOL) 500 MG tablet Take 500 mg by mouth every 6 (six) hours as needed.    . Multiple Vitamins-Minerals (MULTIVITAMIN & MINERAL PO) Take 1 tablet by mouth daily.     . naproxen (NAPROSYN) 375 MG tablet Take 1 tablet (375 mg total) by mouth 2 (two) times daily with a meal for 5 days. For swelling in left wrist. 10 tablet 0  . rosuvastatin (CRESTOR) 20 MG tablet TAKE 1 TABLET BY MOUTH EVERY DAY IN THE EVENING 90 tablet 2   No facility-administered medications prior to visit.    Allergies  Allergen Reactions  . Codeine   . Alendronate     espophagitis / GI pain / unsteady gait    ROS Review of Systems As per HPI.    Objective:    Physical Exam Vitals and nursing note reviewed.  Constitutional:      General: She is  not in acute distress.    Appearance: Normal appearance. She is not ill-appearing, toxic-appearing or diaphoretic.  HENT:     Head: Normocephalic and atraumatic.  Pulmonary:     Effort: Pulmonary effort is normal. No respiratory distress.  Musculoskeletal:     Left wrist: No swelling, effusion or tenderness.     Left hand: No swelling or tenderness.     Comments: No warmth or erythema noted of left wrist or hand.  Neurological:     Mental Status: She is alert and oriented to person, place, and time. Mental status is at baseline.  Psychiatric:        Mood and Affect: Mood normal.        Behavior: Behavior normal.      BP 130/63   Pulse 88   Temp 98.4 F (36.9 C)   Ht 5\' 2"  (1.575 m)   Wt 100 lb 3.2 oz (45.5 kg)   SpO2 98%   BMI 18.33 kg/m  Wt Readings from Last 3 Encounters:  10/05/20 100 lb 3.2 oz (45.5  kg)  09/30/20 100 lb 4.8 oz (45.5 kg)  01/21/20 (!) 94 lb 6.4 oz (42.8 kg)     Health Maintenance Due  Topic Date Due  . COVID-19 Vaccine (1) Never done  . PNA vac Low Risk Adult (2 of 2 - PPSV23) 10/12/2018  . DEXA SCAN  10/12/2019    There are no preventive care reminders to display for this patient.  No results found for: TSH Lab Results  Component Value Date   WBC 8.1 01/21/2020   HGB 13.5 01/21/2020   HCT 41.3 01/21/2020   MCV 95 01/21/2020   PLT 277 01/21/2020   Lab Results  Component Value Date   NA 140 01/21/2020   K 4.1 01/21/2020   CO2 26 01/21/2020   GLUCOSE 83 01/21/2020   BUN 13 01/21/2020   CREATININE 0.69 01/21/2020   BILITOT 0.5 02/15/2018   ALKPHOS 47 02/15/2018   AST 24 02/15/2018   ALT 17 02/15/2018   PROT 6.3 02/15/2018   ALBUMIN 4.3 02/15/2018   CALCIUM 10.1 01/21/2020   ANIONGAP 7 08/22/2018   Lab Results  Component Value Date   CHOL 172 02/15/2018   Lab Results  Component Value Date   HDL 80 02/15/2018   Lab Results  Component Value Date   LDLCALC 81 02/15/2018   Lab Results  Component Value Date   TRIG 53 02/15/2018   Lab Results  Component Value Date   CHOLHDL 2.2 02/15/2018   No results found for: HGBA1C    Assessment & Plan:   Eileen Kidd was seen today for follow-up.  Diagnoses and all orders for this visit:  Swollen wrist, left Treated for possible cellulitis on 09/30/20 with rocephin injection x 1 and naprosyn x5 days. Symptoms have resolved.   Follow-up: Return if symptoms worsen or fail to improve.   The patient indicates understanding of these issues and agrees with the plan.  11/30/20, FNP

## 2020-10-14 DIAGNOSIS — E785 Hyperlipidemia, unspecified: Secondary | ICD-10-CM | POA: Diagnosis not present

## 2020-10-14 DIAGNOSIS — R531 Weakness: Secondary | ICD-10-CM | POA: Diagnosis not present

## 2020-10-14 DIAGNOSIS — R413 Other amnesia: Secondary | ICD-10-CM | POA: Diagnosis not present

## 2020-10-14 DIAGNOSIS — R296 Repeated falls: Secondary | ICD-10-CM | POA: Diagnosis not present

## 2020-11-11 DIAGNOSIS — E785 Hyperlipidemia, unspecified: Secondary | ICD-10-CM | POA: Diagnosis not present

## 2020-11-11 DIAGNOSIS — R531 Weakness: Secondary | ICD-10-CM | POA: Diagnosis not present

## 2020-11-11 DIAGNOSIS — R413 Other amnesia: Secondary | ICD-10-CM | POA: Diagnosis not present

## 2020-12-09 DIAGNOSIS — L0390 Cellulitis, unspecified: Secondary | ICD-10-CM | POA: Diagnosis not present

## 2020-12-09 DIAGNOSIS — R413 Other amnesia: Secondary | ICD-10-CM | POA: Diagnosis not present

## 2020-12-09 DIAGNOSIS — R011 Cardiac murmur, unspecified: Secondary | ICD-10-CM | POA: Diagnosis not present

## 2020-12-09 DIAGNOSIS — R531 Weakness: Secondary | ICD-10-CM | POA: Diagnosis not present

## 2020-12-09 DIAGNOSIS — E785 Hyperlipidemia, unspecified: Secondary | ICD-10-CM | POA: Diagnosis not present

## 2021-01-05 DIAGNOSIS — R413 Other amnesia: Secondary | ICD-10-CM | POA: Diagnosis not present

## 2021-01-05 DIAGNOSIS — R011 Cardiac murmur, unspecified: Secondary | ICD-10-CM | POA: Diagnosis not present

## 2021-01-05 DIAGNOSIS — E785 Hyperlipidemia, unspecified: Secondary | ICD-10-CM | POA: Diagnosis not present

## 2021-01-05 DIAGNOSIS — L84 Corns and callosities: Secondary | ICD-10-CM | POA: Diagnosis not present

## 2021-01-05 DIAGNOSIS — R531 Weakness: Secondary | ICD-10-CM | POA: Diagnosis not present

## 2021-01-05 DIAGNOSIS — R296 Repeated falls: Secondary | ICD-10-CM | POA: Diagnosis not present

## 2021-01-11 DIAGNOSIS — E559 Vitamin D deficiency, unspecified: Secondary | ICD-10-CM | POA: Diagnosis not present

## 2021-01-11 DIAGNOSIS — E119 Type 2 diabetes mellitus without complications: Secondary | ICD-10-CM | POA: Diagnosis not present

## 2021-01-11 DIAGNOSIS — E038 Other specified hypothyroidism: Secondary | ICD-10-CM | POA: Diagnosis not present

## 2021-01-11 DIAGNOSIS — E785 Hyperlipidemia, unspecified: Secondary | ICD-10-CM | POA: Diagnosis not present

## 2021-01-11 DIAGNOSIS — D518 Other vitamin B12 deficiency anemias: Secondary | ICD-10-CM | POA: Diagnosis not present

## 2021-01-18 DIAGNOSIS — E559 Vitamin D deficiency, unspecified: Secondary | ICD-10-CM | POA: Diagnosis not present

## 2021-01-18 DIAGNOSIS — E119 Type 2 diabetes mellitus without complications: Secondary | ICD-10-CM | POA: Diagnosis not present

## 2021-01-18 DIAGNOSIS — D518 Other vitamin B12 deficiency anemias: Secondary | ICD-10-CM | POA: Diagnosis not present

## 2021-01-18 DIAGNOSIS — E7849 Other hyperlipidemia: Secondary | ICD-10-CM | POA: Diagnosis not present

## 2021-01-18 DIAGNOSIS — Z79899 Other long term (current) drug therapy: Secondary | ICD-10-CM | POA: Diagnosis not present

## 2021-01-26 DIAGNOSIS — R011 Cardiac murmur, unspecified: Secondary | ICD-10-CM | POA: Diagnosis not present

## 2021-02-02 DIAGNOSIS — L84 Corns and callosities: Secondary | ICD-10-CM | POA: Diagnosis not present

## 2021-02-02 DIAGNOSIS — E785 Hyperlipidemia, unspecified: Secondary | ICD-10-CM | POA: Diagnosis not present

## 2021-02-02 DIAGNOSIS — M1990 Unspecified osteoarthritis, unspecified site: Secondary | ICD-10-CM | POA: Diagnosis not present

## 2021-02-02 DIAGNOSIS — R531 Weakness: Secondary | ICD-10-CM | POA: Diagnosis not present

## 2021-02-02 DIAGNOSIS — R413 Other amnesia: Secondary | ICD-10-CM | POA: Diagnosis not present

## 2021-02-02 DIAGNOSIS — R296 Repeated falls: Secondary | ICD-10-CM | POA: Diagnosis not present

## 2021-02-02 DIAGNOSIS — R011 Cardiac murmur, unspecified: Secondary | ICD-10-CM | POA: Diagnosis not present

## 2021-02-04 DIAGNOSIS — E785 Hyperlipidemia, unspecified: Secondary | ICD-10-CM | POA: Diagnosis not present

## 2021-02-04 DIAGNOSIS — M1990 Unspecified osteoarthritis, unspecified site: Secondary | ICD-10-CM | POA: Diagnosis not present

## 2021-02-04 DIAGNOSIS — E559 Vitamin D deficiency, unspecified: Secondary | ICD-10-CM | POA: Diagnosis not present

## 2021-02-04 DIAGNOSIS — D518 Other vitamin B12 deficiency anemias: Secondary | ICD-10-CM | POA: Diagnosis not present

## 2021-02-04 DIAGNOSIS — E119 Type 2 diabetes mellitus without complications: Secondary | ICD-10-CM | POA: Diagnosis not present

## 2021-02-04 DIAGNOSIS — E038 Other specified hypothyroidism: Secondary | ICD-10-CM | POA: Diagnosis not present

## 2021-03-02 DIAGNOSIS — R011 Cardiac murmur, unspecified: Secondary | ICD-10-CM | POA: Diagnosis not present

## 2021-03-02 DIAGNOSIS — M1990 Unspecified osteoarthritis, unspecified site: Secondary | ICD-10-CM | POA: Diagnosis not present

## 2021-03-02 DIAGNOSIS — R531 Weakness: Secondary | ICD-10-CM | POA: Diagnosis not present

## 2021-03-02 DIAGNOSIS — E785 Hyperlipidemia, unspecified: Secondary | ICD-10-CM | POA: Diagnosis not present

## 2021-03-02 DIAGNOSIS — R296 Repeated falls: Secondary | ICD-10-CM | POA: Diagnosis not present

## 2021-03-15 DIAGNOSIS — E038 Other specified hypothyroidism: Secondary | ICD-10-CM | POA: Diagnosis not present

## 2021-03-15 DIAGNOSIS — M1990 Unspecified osteoarthritis, unspecified site: Secondary | ICD-10-CM | POA: Diagnosis not present

## 2021-03-15 DIAGNOSIS — E119 Type 2 diabetes mellitus without complications: Secondary | ICD-10-CM | POA: Diagnosis not present

## 2021-03-15 DIAGNOSIS — E785 Hyperlipidemia, unspecified: Secondary | ICD-10-CM | POA: Diagnosis not present

## 2021-03-15 DIAGNOSIS — E559 Vitamin D deficiency, unspecified: Secondary | ICD-10-CM | POA: Diagnosis not present

## 2021-03-15 DIAGNOSIS — D518 Other vitamin B12 deficiency anemias: Secondary | ICD-10-CM | POA: Diagnosis not present

## 2021-03-30 DIAGNOSIS — M1990 Unspecified osteoarthritis, unspecified site: Secondary | ICD-10-CM | POA: Diagnosis not present

## 2021-03-30 DIAGNOSIS — R296 Repeated falls: Secondary | ICD-10-CM | POA: Diagnosis not present

## 2021-03-30 DIAGNOSIS — E785 Hyperlipidemia, unspecified: Secondary | ICD-10-CM | POA: Diagnosis not present

## 2021-03-30 DIAGNOSIS — R413 Other amnesia: Secondary | ICD-10-CM | POA: Diagnosis not present

## 2021-03-30 DIAGNOSIS — R531 Weakness: Secondary | ICD-10-CM | POA: Diagnosis not present

## 2021-03-30 DIAGNOSIS — R011 Cardiac murmur, unspecified: Secondary | ICD-10-CM | POA: Diagnosis not present

## 2021-04-05 DIAGNOSIS — L84 Corns and callosities: Secondary | ICD-10-CM | POA: Diagnosis not present

## 2021-04-05 DIAGNOSIS — M2012 Hallux valgus (acquired), left foot: Secondary | ICD-10-CM | POA: Diagnosis not present

## 2021-04-05 DIAGNOSIS — M2011 Hallux valgus (acquired), right foot: Secondary | ICD-10-CM | POA: Diagnosis not present

## 2021-04-05 DIAGNOSIS — M2042 Other hammer toe(s) (acquired), left foot: Secondary | ICD-10-CM | POA: Diagnosis not present

## 2021-04-05 DIAGNOSIS — L603 Nail dystrophy: Secondary | ICD-10-CM | POA: Diagnosis not present

## 2021-04-05 DIAGNOSIS — M2041 Other hammer toe(s) (acquired), right foot: Secondary | ICD-10-CM | POA: Diagnosis not present

## 2021-04-06 DIAGNOSIS — E7849 Other hyperlipidemia: Secondary | ICD-10-CM | POA: Diagnosis not present

## 2021-04-06 DIAGNOSIS — E119 Type 2 diabetes mellitus without complications: Secondary | ICD-10-CM | POA: Diagnosis not present

## 2021-04-06 DIAGNOSIS — Z79899 Other long term (current) drug therapy: Secondary | ICD-10-CM | POA: Diagnosis not present

## 2021-04-06 DIAGNOSIS — D518 Other vitamin B12 deficiency anemias: Secondary | ICD-10-CM | POA: Diagnosis not present

## 2021-04-06 DIAGNOSIS — E038 Other specified hypothyroidism: Secondary | ICD-10-CM | POA: Diagnosis not present

## 2021-04-08 DIAGNOSIS — M1990 Unspecified osteoarthritis, unspecified site: Secondary | ICD-10-CM | POA: Diagnosis not present

## 2021-04-08 DIAGNOSIS — E559 Vitamin D deficiency, unspecified: Secondary | ICD-10-CM | POA: Diagnosis not present

## 2021-04-08 DIAGNOSIS — E119 Type 2 diabetes mellitus without complications: Secondary | ICD-10-CM | POA: Diagnosis not present

## 2021-04-08 DIAGNOSIS — E038 Other specified hypothyroidism: Secondary | ICD-10-CM | POA: Diagnosis not present

## 2021-04-08 DIAGNOSIS — E785 Hyperlipidemia, unspecified: Secondary | ICD-10-CM | POA: Diagnosis not present

## 2021-04-08 DIAGNOSIS — D518 Other vitamin B12 deficiency anemias: Secondary | ICD-10-CM | POA: Diagnosis not present

## 2021-04-27 DIAGNOSIS — R413 Other amnesia: Secondary | ICD-10-CM | POA: Diagnosis not present

## 2021-04-27 DIAGNOSIS — L0390 Cellulitis, unspecified: Secondary | ICD-10-CM | POA: Diagnosis not present

## 2021-04-27 DIAGNOSIS — E785 Hyperlipidemia, unspecified: Secondary | ICD-10-CM | POA: Diagnosis not present

## 2021-04-27 DIAGNOSIS — M1990 Unspecified osteoarthritis, unspecified site: Secondary | ICD-10-CM | POA: Diagnosis not present

## 2021-04-27 DIAGNOSIS — R011 Cardiac murmur, unspecified: Secondary | ICD-10-CM | POA: Diagnosis not present

## 2021-05-06 DIAGNOSIS — M1990 Unspecified osteoarthritis, unspecified site: Secondary | ICD-10-CM | POA: Diagnosis not present

## 2021-05-06 DIAGNOSIS — E119 Type 2 diabetes mellitus without complications: Secondary | ICD-10-CM | POA: Diagnosis not present

## 2021-05-06 DIAGNOSIS — E785 Hyperlipidemia, unspecified: Secondary | ICD-10-CM | POA: Diagnosis not present

## 2021-05-06 DIAGNOSIS — E038 Other specified hypothyroidism: Secondary | ICD-10-CM | POA: Diagnosis not present

## 2021-05-06 DIAGNOSIS — D518 Other vitamin B12 deficiency anemias: Secondary | ICD-10-CM | POA: Diagnosis not present

## 2021-05-06 DIAGNOSIS — E559 Vitamin D deficiency, unspecified: Secondary | ICD-10-CM | POA: Diagnosis not present

## 2021-05-25 DIAGNOSIS — E785 Hyperlipidemia, unspecified: Secondary | ICD-10-CM | POA: Diagnosis not present

## 2021-05-25 DIAGNOSIS — R413 Other amnesia: Secondary | ICD-10-CM | POA: Diagnosis not present

## 2021-05-25 DIAGNOSIS — R011 Cardiac murmur, unspecified: Secondary | ICD-10-CM | POA: Diagnosis not present

## 2021-05-25 DIAGNOSIS — R296 Repeated falls: Secondary | ICD-10-CM | POA: Diagnosis not present

## 2021-05-25 DIAGNOSIS — H9190 Unspecified hearing loss, unspecified ear: Secondary | ICD-10-CM | POA: Diagnosis not present

## 2021-05-25 DIAGNOSIS — M1990 Unspecified osteoarthritis, unspecified site: Secondary | ICD-10-CM | POA: Diagnosis not present

## 2021-06-04 DIAGNOSIS — D518 Other vitamin B12 deficiency anemias: Secondary | ICD-10-CM | POA: Diagnosis not present

## 2021-06-04 DIAGNOSIS — E559 Vitamin D deficiency, unspecified: Secondary | ICD-10-CM | POA: Diagnosis not present

## 2021-06-04 DIAGNOSIS — M1990 Unspecified osteoarthritis, unspecified site: Secondary | ICD-10-CM | POA: Diagnosis not present

## 2021-06-04 DIAGNOSIS — E785 Hyperlipidemia, unspecified: Secondary | ICD-10-CM | POA: Diagnosis not present

## 2021-06-04 DIAGNOSIS — E119 Type 2 diabetes mellitus without complications: Secondary | ICD-10-CM | POA: Diagnosis not present

## 2021-06-04 DIAGNOSIS — E038 Other specified hypothyroidism: Secondary | ICD-10-CM | POA: Diagnosis not present

## 2021-06-24 DIAGNOSIS — M1990 Unspecified osteoarthritis, unspecified site: Secondary | ICD-10-CM | POA: Diagnosis not present

## 2021-06-24 DIAGNOSIS — R413 Other amnesia: Secondary | ICD-10-CM | POA: Diagnosis not present

## 2021-06-24 DIAGNOSIS — H9190 Unspecified hearing loss, unspecified ear: Secondary | ICD-10-CM | POA: Diagnosis not present

## 2021-06-24 DIAGNOSIS — E785 Hyperlipidemia, unspecified: Secondary | ICD-10-CM | POA: Diagnosis not present

## 2021-06-24 DIAGNOSIS — R531 Weakness: Secondary | ICD-10-CM | POA: Diagnosis not present

## 2021-06-24 DIAGNOSIS — R296 Repeated falls: Secondary | ICD-10-CM | POA: Diagnosis not present

## 2021-06-30 DIAGNOSIS — E119 Type 2 diabetes mellitus without complications: Secondary | ICD-10-CM | POA: Diagnosis not present

## 2021-06-30 DIAGNOSIS — M1990 Unspecified osteoarthritis, unspecified site: Secondary | ICD-10-CM | POA: Diagnosis not present

## 2021-06-30 DIAGNOSIS — D518 Other vitamin B12 deficiency anemias: Secondary | ICD-10-CM | POA: Diagnosis not present

## 2021-06-30 DIAGNOSIS — E038 Other specified hypothyroidism: Secondary | ICD-10-CM | POA: Diagnosis not present

## 2021-06-30 DIAGNOSIS — E785 Hyperlipidemia, unspecified: Secondary | ICD-10-CM | POA: Diagnosis not present

## 2021-06-30 DIAGNOSIS — E559 Vitamin D deficiency, unspecified: Secondary | ICD-10-CM | POA: Diagnosis not present

## 2021-07-15 DIAGNOSIS — L603 Nail dystrophy: Secondary | ICD-10-CM | POA: Diagnosis not present

## 2021-07-22 DIAGNOSIS — R413 Other amnesia: Secondary | ICD-10-CM | POA: Diagnosis not present

## 2021-07-22 DIAGNOSIS — H9190 Unspecified hearing loss, unspecified ear: Secondary | ICD-10-CM | POA: Diagnosis not present

## 2021-07-22 DIAGNOSIS — E785 Hyperlipidemia, unspecified: Secondary | ICD-10-CM | POA: Diagnosis not present

## 2021-07-22 DIAGNOSIS — M1990 Unspecified osteoarthritis, unspecified site: Secondary | ICD-10-CM | POA: Diagnosis not present

## 2021-07-22 DIAGNOSIS — R531 Weakness: Secondary | ICD-10-CM | POA: Diagnosis not present

## 2021-07-22 DIAGNOSIS — R011 Cardiac murmur, unspecified: Secondary | ICD-10-CM | POA: Diagnosis not present

## 2021-07-22 DIAGNOSIS — R296 Repeated falls: Secondary | ICD-10-CM | POA: Diagnosis not present

## 2021-07-23 DIAGNOSIS — E782 Mixed hyperlipidemia: Secondary | ICD-10-CM | POA: Diagnosis not present

## 2021-07-23 DIAGNOSIS — E559 Vitamin D deficiency, unspecified: Secondary | ICD-10-CM | POA: Diagnosis not present

## 2021-07-23 DIAGNOSIS — E038 Other specified hypothyroidism: Secondary | ICD-10-CM | POA: Diagnosis not present

## 2021-07-23 DIAGNOSIS — E119 Type 2 diabetes mellitus without complications: Secondary | ICD-10-CM | POA: Diagnosis not present

## 2021-07-23 DIAGNOSIS — D518 Other vitamin B12 deficiency anemias: Secondary | ICD-10-CM | POA: Diagnosis not present

## 2021-07-23 DIAGNOSIS — Z79899 Other long term (current) drug therapy: Secondary | ICD-10-CM | POA: Diagnosis not present

## 2021-07-30 DIAGNOSIS — E038 Other specified hypothyroidism: Secondary | ICD-10-CM | POA: Diagnosis not present

## 2021-07-30 DIAGNOSIS — D518 Other vitamin B12 deficiency anemias: Secondary | ICD-10-CM | POA: Diagnosis not present

## 2021-07-30 DIAGNOSIS — E119 Type 2 diabetes mellitus without complications: Secondary | ICD-10-CM | POA: Diagnosis not present

## 2021-07-30 DIAGNOSIS — E785 Hyperlipidemia, unspecified: Secondary | ICD-10-CM | POA: Diagnosis not present

## 2021-07-30 DIAGNOSIS — M1990 Unspecified osteoarthritis, unspecified site: Secondary | ICD-10-CM | POA: Diagnosis not present

## 2021-07-30 DIAGNOSIS — E559 Vitamin D deficiency, unspecified: Secondary | ICD-10-CM | POA: Diagnosis not present

## 2021-08-24 DIAGNOSIS — E785 Hyperlipidemia, unspecified: Secondary | ICD-10-CM | POA: Diagnosis not present

## 2021-08-24 DIAGNOSIS — R413 Other amnesia: Secondary | ICD-10-CM | POA: Diagnosis not present

## 2021-08-24 DIAGNOSIS — R531 Weakness: Secondary | ICD-10-CM | POA: Diagnosis not present

## 2021-08-24 DIAGNOSIS — E876 Hypokalemia: Secondary | ICD-10-CM | POA: Diagnosis not present

## 2021-08-24 DIAGNOSIS — H9190 Unspecified hearing loss, unspecified ear: Secondary | ICD-10-CM | POA: Diagnosis not present

## 2021-08-24 DIAGNOSIS — R296 Repeated falls: Secondary | ICD-10-CM | POA: Diagnosis not present

## 2021-08-24 DIAGNOSIS — M1990 Unspecified osteoarthritis, unspecified site: Secondary | ICD-10-CM | POA: Diagnosis not present

## 2021-08-24 DIAGNOSIS — R011 Cardiac murmur, unspecified: Secondary | ICD-10-CM | POA: Diagnosis not present

## 2021-08-24 DIAGNOSIS — E781 Pure hyperglyceridemia: Secondary | ICD-10-CM | POA: Diagnosis not present

## 2021-08-24 DIAGNOSIS — D508 Other iron deficiency anemias: Secondary | ICD-10-CM | POA: Diagnosis not present

## 2021-08-27 DIAGNOSIS — D518 Other vitamin B12 deficiency anemias: Secondary | ICD-10-CM | POA: Diagnosis not present

## 2021-08-27 DIAGNOSIS — D508 Other iron deficiency anemias: Secondary | ICD-10-CM | POA: Diagnosis not present

## 2021-08-27 DIAGNOSIS — E785 Hyperlipidemia, unspecified: Secondary | ICD-10-CM | POA: Diagnosis not present

## 2021-08-27 DIAGNOSIS — E038 Other specified hypothyroidism: Secondary | ICD-10-CM | POA: Diagnosis not present

## 2021-08-27 DIAGNOSIS — M1990 Unspecified osteoarthritis, unspecified site: Secondary | ICD-10-CM | POA: Diagnosis not present

## 2021-08-27 DIAGNOSIS — E559 Vitamin D deficiency, unspecified: Secondary | ICD-10-CM | POA: Diagnosis not present

## 2021-08-27 DIAGNOSIS — E119 Type 2 diabetes mellitus without complications: Secondary | ICD-10-CM | POA: Diagnosis not present

## 2021-08-27 DIAGNOSIS — E781 Pure hyperglyceridemia: Secondary | ICD-10-CM | POA: Diagnosis not present

## 2021-09-21 DIAGNOSIS — R011 Cardiac murmur, unspecified: Secondary | ICD-10-CM | POA: Diagnosis not present

## 2021-09-21 DIAGNOSIS — D508 Other iron deficiency anemias: Secondary | ICD-10-CM | POA: Diagnosis not present

## 2021-09-21 DIAGNOSIS — E785 Hyperlipidemia, unspecified: Secondary | ICD-10-CM | POA: Diagnosis not present

## 2021-09-21 DIAGNOSIS — E781 Pure hyperglyceridemia: Secondary | ICD-10-CM | POA: Diagnosis not present

## 2021-09-21 DIAGNOSIS — E876 Hypokalemia: Secondary | ICD-10-CM | POA: Diagnosis not present

## 2021-09-21 DIAGNOSIS — M1990 Unspecified osteoarthritis, unspecified site: Secondary | ICD-10-CM | POA: Diagnosis not present

## 2021-09-21 DIAGNOSIS — R531 Weakness: Secondary | ICD-10-CM | POA: Diagnosis not present

## 2021-09-21 DIAGNOSIS — R413 Other amnesia: Secondary | ICD-10-CM | POA: Diagnosis not present

## 2021-09-21 DIAGNOSIS — R296 Repeated falls: Secondary | ICD-10-CM | POA: Diagnosis not present

## 2021-09-30 DIAGNOSIS — E785 Hyperlipidemia, unspecified: Secondary | ICD-10-CM | POA: Diagnosis not present

## 2021-09-30 DIAGNOSIS — D508 Other iron deficiency anemias: Secondary | ICD-10-CM | POA: Diagnosis not present

## 2021-09-30 DIAGNOSIS — E781 Pure hyperglyceridemia: Secondary | ICD-10-CM | POA: Diagnosis not present

## 2021-09-30 DIAGNOSIS — E559 Vitamin D deficiency, unspecified: Secondary | ICD-10-CM | POA: Diagnosis not present

## 2021-09-30 DIAGNOSIS — D518 Other vitamin B12 deficiency anemias: Secondary | ICD-10-CM | POA: Diagnosis not present

## 2021-09-30 DIAGNOSIS — M1990 Unspecified osteoarthritis, unspecified site: Secondary | ICD-10-CM | POA: Diagnosis not present

## 2021-09-30 DIAGNOSIS — E038 Other specified hypothyroidism: Secondary | ICD-10-CM | POA: Diagnosis not present

## 2021-09-30 DIAGNOSIS — E119 Type 2 diabetes mellitus without complications: Secondary | ICD-10-CM | POA: Diagnosis not present

## 2021-10-06 DIAGNOSIS — L84 Corns and callosities: Secondary | ICD-10-CM | POA: Diagnosis not present

## 2021-10-06 DIAGNOSIS — L603 Nail dystrophy: Secondary | ICD-10-CM | POA: Diagnosis not present

## 2021-10-19 DIAGNOSIS — E781 Pure hyperglyceridemia: Secondary | ICD-10-CM | POA: Diagnosis not present

## 2021-10-19 DIAGNOSIS — H9190 Unspecified hearing loss, unspecified ear: Secondary | ICD-10-CM | POA: Diagnosis not present

## 2021-10-19 DIAGNOSIS — E785 Hyperlipidemia, unspecified: Secondary | ICD-10-CM | POA: Diagnosis not present

## 2021-10-19 DIAGNOSIS — M1990 Unspecified osteoarthritis, unspecified site: Secondary | ICD-10-CM | POA: Diagnosis not present

## 2021-10-19 DIAGNOSIS — R531 Weakness: Secondary | ICD-10-CM | POA: Diagnosis not present

## 2021-10-19 DIAGNOSIS — R296 Repeated falls: Secondary | ICD-10-CM | POA: Diagnosis not present

## 2021-10-28 DIAGNOSIS — Z79899 Other long term (current) drug therapy: Secondary | ICD-10-CM | POA: Diagnosis not present

## 2021-10-28 DIAGNOSIS — D518 Other vitamin B12 deficiency anemias: Secondary | ICD-10-CM | POA: Diagnosis not present

## 2021-10-28 DIAGNOSIS — E559 Vitamin D deficiency, unspecified: Secondary | ICD-10-CM | POA: Diagnosis not present

## 2021-10-28 DIAGNOSIS — E119 Type 2 diabetes mellitus without complications: Secondary | ICD-10-CM | POA: Diagnosis not present

## 2021-10-28 DIAGNOSIS — E7849 Other hyperlipidemia: Secondary | ICD-10-CM | POA: Diagnosis not present

## 2021-11-08 DIAGNOSIS — D508 Other iron deficiency anemias: Secondary | ICD-10-CM | POA: Diagnosis not present

## 2021-11-08 DIAGNOSIS — E785 Hyperlipidemia, unspecified: Secondary | ICD-10-CM | POA: Diagnosis not present

## 2021-11-08 DIAGNOSIS — M1990 Unspecified osteoarthritis, unspecified site: Secondary | ICD-10-CM | POA: Diagnosis not present

## 2021-11-08 DIAGNOSIS — E781 Pure hyperglyceridemia: Secondary | ICD-10-CM | POA: Diagnosis not present

## 2021-11-08 DIAGNOSIS — E038 Other specified hypothyroidism: Secondary | ICD-10-CM | POA: Diagnosis not present

## 2021-11-08 DIAGNOSIS — E782 Mixed hyperlipidemia: Secondary | ICD-10-CM | POA: Diagnosis not present

## 2021-11-08 DIAGNOSIS — E559 Vitamin D deficiency, unspecified: Secondary | ICD-10-CM | POA: Diagnosis not present

## 2021-11-08 DIAGNOSIS — D518 Other vitamin B12 deficiency anemias: Secondary | ICD-10-CM | POA: Diagnosis not present

## 2021-11-08 DIAGNOSIS — E119 Type 2 diabetes mellitus without complications: Secondary | ICD-10-CM | POA: Diagnosis not present

## 2021-11-16 DIAGNOSIS — R413 Other amnesia: Secondary | ICD-10-CM | POA: Diagnosis not present

## 2021-11-16 DIAGNOSIS — M1990 Unspecified osteoarthritis, unspecified site: Secondary | ICD-10-CM | POA: Diagnosis not present

## 2021-11-16 DIAGNOSIS — E782 Mixed hyperlipidemia: Secondary | ICD-10-CM | POA: Diagnosis not present

## 2021-11-16 DIAGNOSIS — R011 Cardiac murmur, unspecified: Secondary | ICD-10-CM | POA: Diagnosis not present

## 2021-11-20 DIAGNOSIS — I10 Essential (primary) hypertension: Secondary | ICD-10-CM | POA: Diagnosis not present

## 2021-12-18 ENCOUNTER — Emergency Department (HOSPITAL_COMMUNITY)
Admission: EM | Admit: 2021-12-18 | Discharge: 2021-12-18 | Disposition: A | Discharge status: Skilled Nursing Facility | Payer: Medicare Other | Attending: Emergency Medicine | Admitting: Emergency Medicine

## 2021-12-18 ENCOUNTER — Emergency Department (HOSPITAL_COMMUNITY): Payer: Medicare Other

## 2021-12-18 ENCOUNTER — Other Ambulatory Visit: Payer: Self-pay

## 2021-12-18 ENCOUNTER — Encounter (HOSPITAL_COMMUNITY): Payer: Self-pay | Admitting: *Deleted

## 2021-12-18 DIAGNOSIS — S60052A Contusion of left little finger without damage to nail, initial encounter: Secondary | ICD-10-CM | POA: Diagnosis not present

## 2021-12-18 DIAGNOSIS — Z87891 Personal history of nicotine dependence: Secondary | ICD-10-CM | POA: Insufficient documentation

## 2021-12-18 DIAGNOSIS — M19042 Primary osteoarthritis, left hand: Secondary | ICD-10-CM | POA: Diagnosis not present

## 2021-12-18 DIAGNOSIS — Z743 Need for continuous supervision: Secondary | ICD-10-CM | POA: Diagnosis not present

## 2021-12-18 DIAGNOSIS — M25552 Pain in left hip: Secondary | ICD-10-CM | POA: Diagnosis not present

## 2021-12-18 DIAGNOSIS — F0390 Unspecified dementia without behavioral disturbance: Secondary | ICD-10-CM | POA: Diagnosis not present

## 2021-12-18 DIAGNOSIS — M549 Dorsalgia, unspecified: Secondary | ICD-10-CM | POA: Diagnosis not present

## 2021-12-18 DIAGNOSIS — W19XXXA Unspecified fall, initial encounter: Secondary | ICD-10-CM | POA: Insufficient documentation

## 2021-12-18 LAB — URINALYSIS, ROUTINE W REFLEX MICROSCOPIC
Bacteria, UA: NONE SEEN
Bilirubin Urine: NEGATIVE
Glucose, UA: NEGATIVE mg/dL
Hgb urine dipstick: NEGATIVE
Ketones, ur: NEGATIVE mg/dL
Leukocytes,Ua: NEGATIVE
Nitrite: NEGATIVE
Protein, ur: 30 mg/dL — AB
Specific Gravity, Urine: 1.008 (ref 1.005–1.030)
pH: 8 (ref 5.0–8.0)

## 2021-12-18 LAB — CBC WITH DIFFERENTIAL/PLATELET
Abs Immature Granulocytes: 0.04 10*3/uL (ref 0.00–0.07)
Basophils Absolute: 0 10*3/uL (ref 0.0–0.1)
Basophils Relative: 0 %
Eosinophils Absolute: 0 10*3/uL (ref 0.0–0.5)
Eosinophils Relative: 0 %
HCT: 41 % (ref 36.0–46.0)
Hemoglobin: 13.9 g/dL (ref 12.0–15.0)
Immature Granulocytes: 0 %
Lymphocytes Relative: 9 %
Lymphs Abs: 1 10*3/uL (ref 0.7–4.0)
MCH: 31.1 pg (ref 26.0–34.0)
MCHC: 33.9 g/dL (ref 30.0–36.0)
MCV: 91.7 fL (ref 80.0–100.0)
Monocytes Absolute: 0.7 10*3/uL (ref 0.1–1.0)
Monocytes Relative: 7 %
Neutro Abs: 9.1 10*3/uL — ABNORMAL HIGH (ref 1.7–7.7)
Neutrophils Relative %: 84 %
Platelets: 262 10*3/uL (ref 150–400)
RBC: 4.47 MIL/uL (ref 3.87–5.11)
RDW: 14.6 % (ref 11.5–15.5)
WBC: 10.9 10*3/uL — ABNORMAL HIGH (ref 4.0–10.5)
nRBC: 0 % (ref 0.0–0.2)

## 2021-12-18 LAB — BASIC METABOLIC PANEL
Anion gap: 8 (ref 5–15)
BUN: 16 mg/dL (ref 8–23)
CO2: 26 mmol/L (ref 22–32)
Calcium: 9.6 mg/dL (ref 8.9–10.3)
Chloride: 108 mmol/L (ref 98–111)
Creatinine, Ser: 0.76 mg/dL (ref 0.44–1.00)
GFR, Estimated: >60 mL/min (ref >60)
Glucose, Bld: 124 mg/dL — ABNORMAL HIGH (ref 70–99)
Potassium: 3.5 mmol/L (ref 3.5–5.1)
Sodium: 142 mmol/L (ref 135–145)

## 2021-12-18 NOTE — ED Notes (Signed)
When applying purewick on patient, patient states left hip pain and patient has bruising to left buttock. Patient states she now remembers she fell on her butt; patient is poor historian.

## 2021-12-18 NOTE — ED Provider Notes (Signed)
[facility] EMERGENCY DEPARTMENT Provider Note   CSN: [id number] Arrival date & time: 12/18/21  0710     History  Chief Complaint  Patient presents with   Eileen Kidd is a 81 y.o. female.  Patient brought in by EMS following a fall.  The fall was yesterday.  Patient does not recall falling.  Also bruising and swelling to the left pinky.  Some question of left hip pain with some bruising on the left buttocks area.  Patient poor historian.  Past medical history significant for arthritis osteoporosis hyperlipidemia.  Patient's had abdominal hysterectomy.  Patient former smoker quit in 2019.       Home Medications Prior to Admission medications   Medication Sig Start Date End Date Taking? Authorizing Provider  acetaminophen (TYLENOL) 500 MG tablet Take 500 mg by mouth every 6 (six) hours as needed.    [provider]  Multiple Vitamins-Minerals (MULTIVITAMIN & MINERAL PO) Take 1 tablet by mouth daily.     [provider]  rosuvastatin (CRESTOR) 20 MG tablet TAKE 1 TABLET BY MOUTH EVERY DAY IN THE EVENING 02/27/20   Dettinger, Elige Radon, MD      Allergies    Codeine and Alendronate    Review of Systems   Review of Systems  Unable to perform ROS: Dementia    Physical Exam Updated Vital Signs BP (!) 135/99   Pulse 73   Temp 98.5 F (36.9 C) (Oral)   Resp 20   Ht 1.575 m (5\' 2" )   Wt 45.5 kg   SpO2 95%   BMI 18.35 kg/m  Physical Exam Vitals and nursing note reviewed.  Constitutional:      General: She is not in acute distress.    Appearance: Normal appearance. She is well-developed. She is not ill-appearing.  HENT:     Head: Normocephalic and atraumatic.  Eyes:     Conjunctiva/sclera: Conjunctivae normal.  Cardiovascular:     Rate and Rhythm: Normal rate and regular rhythm.     Heart sounds: No murmur heard. Pulmonary:     Effort: Pulmonary effort is normal. No respiratory distress.     Breath sounds: Normal breath sounds.   Abdominal:     Palpations: Abdomen is soft.     Tenderness: There is no abdominal tenderness.  Musculoskeletal:        General: No swelling.     Cervical back: Neck supple.     Comments: Swelling to the left pinky area.  And bruising.  Arthritic changes.  Radial pulses 2+.  Cap refill intact.  Sensation intact.  Good movement of both legs without pain.  Some questionable bruising to the left buttocks area.  No obvious hip deformity or shortening of the leg.  Neurovascular intact distally.  Skin:    General: Skin is warm and dry.     Capillary Refill: Capillary refill takes less than 2 seconds.  Neurological:     General: No focal deficit present.     Mental Status: She is alert. Mental status is at baseline.  Psychiatric:        Mood and Affect: Mood normal.     ED Results / Procedures / Treatments   Labs (all labs ordered are listed, but only abnormal results are displayed) Labs Reviewed  URINALYSIS, ROUTINE W REFLEX MICROSCOPIC - Abnormal; Notable for the following components:      Result Value   APPearance CLOUDY (*)    Protein, ur 30 (*)  All other components within normal limits  CBC WITH DIFFERENTIAL/PLATELET - Abnormal; Notable for the following components:   WBC 10.9 (*)    Neutro Abs 9.1 (*)    All other components within normal limits  BASIC METABOLIC PANEL - Abnormal; Notable for the following components:   Glucose, Bld 124 (*)    All other components within normal limits    EKG None  Radiology DG Finger Little Left  Result Date: 12/18/2021 CLINICAL DATA:  Fall with left pinky finger pain EXAM: LEFT LITTLE FINGER 2+V COMPARISON:  None Available. FINDINGS: Prominent degenerative interphalangeal joint space narrowing and spurring. No acute fracture or dislocation. Lateral positioning of the little digit which is presumably positional. IMPRESSION: 1. No acute fracture or dislocation. 2. Pronounced osteopenia and interphalangeal osteoarthritis. Electronically  Signed   By: Tiburcio Pea M.D.   On: 12/18/2021 08:27   DG Hip Unilat W or Wo Pelvis 2-3 Views Left  Result Date: 12/18/2021 CLINICAL DATA:  Fall with left hip pain. EXAM: DG HIP (WITH OR WITHOUT PELVIS) 2-3V LEFT COMPARISON:  Right hip study from 08/18/2018 FINDINGS: Three view study. Bones are diffusely demineralized. Fixation hardware noted right femoral neck, incompletely visualized. SI joints and symphysis pubis unremarkable. Question cortical defect left inferior pubic ramus. No evidence for left femoral neck fracture. IMPRESSION: 1. Question cortical defect left inferior pubic ramus. Nondisplaced fracture not excluded. No evidence for left femoral neck fracture. 2. Osteopenia. Electronically Signed   By: Kennith Center M.D.   On: 12/18/2021 08:27    Procedures Procedures    Medications Ordered in ED Medications - No data to display  ED Course/ Medical Decision Making/ A&P                           Medical Decision Making Amount and/or Complexity of Data Reviewed Labs: ordered. Radiology: ordered.   X-ray of the right pinky of the left hand without any acute abnormalities.  X-ray of the pelvis and left hip patient with previous repair of right hip fracture.  No evidence of left hip fracture.  But radiologist does raise some concern about possible nondisplaced cortical defect of the left inferior pubic ramus which could represent a nondisplaced fracture.  If so this would be stable.  Patient normally walks with a walker.  Patient's labs without significant abnormalities.  Urinalysis negative for urinary tract infection.  Basic metabolic panel is normal CBC with a mild leukocytosis with a white count of 10.9 hemoglobin 13.9.  Patient's daughter is here.  Explained to her patient stable for discharge back to facility.   Final Clinical Impression(s) / ED Diagnoses Final diagnoses:  None    Rx / DC Orders ED Discharge Orders     None         Vanetta Mulders,  MD 12/18/21 1052

## 2021-12-20 DIAGNOSIS — D518 Other vitamin B12 deficiency anemias: Secondary | ICD-10-CM | POA: Diagnosis not present

## 2021-12-20 DIAGNOSIS — E038 Other specified hypothyroidism: Secondary | ICD-10-CM | POA: Diagnosis not present

## 2021-12-20 DIAGNOSIS — E781 Pure hyperglyceridemia: Secondary | ICD-10-CM | POA: Diagnosis not present

## 2021-12-20 DIAGNOSIS — E785 Hyperlipidemia, unspecified: Secondary | ICD-10-CM | POA: Diagnosis not present

## 2021-12-20 DIAGNOSIS — E559 Vitamin D deficiency, unspecified: Secondary | ICD-10-CM | POA: Diagnosis not present

## 2021-12-20 DIAGNOSIS — M1990 Unspecified osteoarthritis, unspecified site: Secondary | ICD-10-CM | POA: Diagnosis not present

## 2021-12-20 DIAGNOSIS — E119 Type 2 diabetes mellitus without complications: Secondary | ICD-10-CM | POA: Diagnosis not present

## 2021-12-20 DIAGNOSIS — E782 Mixed hyperlipidemia: Secondary | ICD-10-CM | POA: Diagnosis not present

## 2021-12-20 DIAGNOSIS — D508 Other iron deficiency anemias: Secondary | ICD-10-CM | POA: Diagnosis not present

## 2021-12-21 DIAGNOSIS — E782 Mixed hyperlipidemia: Secondary | ICD-10-CM | POA: Diagnosis not present

## 2021-12-21 DIAGNOSIS — M1990 Unspecified osteoarthritis, unspecified site: Secondary | ICD-10-CM | POA: Diagnosis not present

## 2021-12-21 DIAGNOSIS — R413 Other amnesia: Secondary | ICD-10-CM | POA: Diagnosis not present

## 2021-12-21 DIAGNOSIS — M79652 Pain in left thigh: Secondary | ICD-10-CM | POA: Diagnosis not present

## 2021-12-21 DIAGNOSIS — D508 Other iron deficiency anemias: Secondary | ICD-10-CM | POA: Diagnosis not present

## 2021-12-21 DIAGNOSIS — H9190 Unspecified hearing loss, unspecified ear: Secondary | ICD-10-CM | POA: Diagnosis not present

## 2021-12-21 DIAGNOSIS — R011 Cardiac murmur, unspecified: Secondary | ICD-10-CM | POA: Diagnosis not present

## 2021-12-21 DIAGNOSIS — M25552 Pain in left hip: Secondary | ICD-10-CM | POA: Diagnosis not present

## 2021-12-21 DIAGNOSIS — R296 Repeated falls: Secondary | ICD-10-CM | POA: Diagnosis not present

## 2021-12-22 DIAGNOSIS — I10 Essential (primary) hypertension: Secondary | ICD-10-CM | POA: Diagnosis not present

## 2022-01-03 DIAGNOSIS — R3915 Urgency of urination: Secondary | ICD-10-CM | POA: Diagnosis not present

## 2022-01-03 DIAGNOSIS — R350 Frequency of micturition: Secondary | ICD-10-CM | POA: Diagnosis not present

## 2022-01-05 ENCOUNTER — Telehealth: Payer: Self-pay | Admitting: Radiology

## 2022-01-05 DIAGNOSIS — N390 Urinary tract infection, site not specified: Secondary | ICD-10-CM | POA: Diagnosis not present

## 2022-01-05 NOTE — Telephone Encounter (Signed)
Dr Hilda Lias wants to see her sooner than 01/12/22 if possible, she is new patient for 01/12/22 can someone call to move her up if possible ?

## 2022-01-05 NOTE — Telephone Encounter (Signed)
I offered sooner, however, I will call back to facility again to again try to schedule for sooner date, thank you

## 2022-01-07 DIAGNOSIS — E785 Hyperlipidemia, unspecified: Secondary | ICD-10-CM | POA: Diagnosis not present

## 2022-01-07 DIAGNOSIS — M1990 Unspecified osteoarthritis, unspecified site: Secondary | ICD-10-CM | POA: Diagnosis not present

## 2022-01-07 DIAGNOSIS — E119 Type 2 diabetes mellitus without complications: Secondary | ICD-10-CM | POA: Diagnosis not present

## 2022-01-07 DIAGNOSIS — D508 Other iron deficiency anemias: Secondary | ICD-10-CM | POA: Diagnosis not present

## 2022-01-07 DIAGNOSIS — E781 Pure hyperglyceridemia: Secondary | ICD-10-CM | POA: Diagnosis not present

## 2022-01-07 DIAGNOSIS — E782 Mixed hyperlipidemia: Secondary | ICD-10-CM | POA: Diagnosis not present

## 2022-01-07 DIAGNOSIS — E038 Other specified hypothyroidism: Secondary | ICD-10-CM | POA: Diagnosis not present

## 2022-01-07 DIAGNOSIS — E559 Vitamin D deficiency, unspecified: Secondary | ICD-10-CM | POA: Diagnosis not present

## 2022-01-07 DIAGNOSIS — D518 Other vitamin B12 deficiency anemias: Secondary | ICD-10-CM | POA: Diagnosis not present

## 2022-01-07 DIAGNOSIS — I2510 Atherosclerotic heart disease of native coronary artery without angina pectoris: Secondary | ICD-10-CM | POA: Diagnosis not present

## 2022-01-12 ENCOUNTER — Ambulatory Visit (INDEPENDENT_AMBULATORY_CARE_PROVIDER_SITE_OTHER): Payer: Medicare Other

## 2022-01-12 ENCOUNTER — Encounter: Payer: Self-pay | Admitting: Orthopaedic Surgery

## 2022-01-12 ENCOUNTER — Ambulatory Visit (INDEPENDENT_AMBULATORY_CARE_PROVIDER_SITE_OTHER): Payer: Medicare Other | Admitting: Orthopaedic Surgery

## 2022-01-12 VITALS — BP 139/73 | HR 82 | Ht 62.0 in | Wt 100.0 lb

## 2022-01-12 DIAGNOSIS — S32599A Other specified fracture of unspecified pubis, initial encounter for closed fracture: Secondary | ICD-10-CM

## 2022-01-12 NOTE — Progress Notes (Signed)
Subjective:    Patient ID: Eileen Kidd, female    DOB: 12-07-1940, 81 y.o.   MRN: [id number]  HPI She is a nursing home patient.  She had fall and had X-rays done 12-18-21 showing left inferior pubic ramus fracture.  She has done well.  She has no new trauma. She has no weakness.  I have reviewed notes from [place] point of [place].    Review of Systems  Constitutional:  Positive for activity change.  Musculoskeletal:  Positive for arthralgias.  All other systems reviewed and are negative. For Review of Systems, all other systems reviewed and are negative.  The following is a summary of the past history medically, past history surgically, known current medicines, social history and family history.  This information is gathered electronically by the computer from prior information and documentation.  I review this each visit and have found including this information at this point in the chart is beneficial and informative.   Past Medical History:  Diagnosis Date   Arthritis    left knee   Endometriosis    History of fractured kneecap    LEFT   Hyperlipidemia    Osteoporosis     Past Surgical History:  Procedure Laterality Date   ABDOMINAL HYSTERECTOMY Bilateral    FEMUR IM NAIL Right 08/18/2018   Procedure: INTRAMEDULLARY (IM) FEMORAL NAILING;  Surgeon: Samson Frederic, MD;  Location: [facility];  Service: Orthopedics;  Laterality: Right;   SPINE SURGERY     L5-S1 removal    Current Outpatient Medications on File Prior to Visit  Medication Sig Dispense Refill   acetaminophen (TYLENOL) 500 MG tablet Take 500 mg by mouth every 6 (six) hours as needed.     aspirin 81 MG chewable tablet Chew 81 mg by mouth daily.     ferrous sulfate 325 (65 FE) MG tablet Take 325 mg by mouth daily with breakfast.     Multiple Vitamins-Minerals (MULTIVITAMIN & MINERAL PO) Take 1 tablet by mouth daily.      potassium chloride (KLOR-CON M) 10 MEQ tablet Take 10 mEq by mouth daily.     rosuvastatin  (CRESTOR) 40 MG tablet Take 40 mg by mouth daily.     traMADol (ULTRAM) 50 MG tablet Take 50 mg by mouth every 8 (eight) hours as needed (pain).     rosuvastatin (CRESTOR) 20 MG tablet TAKE 1 TABLET BY MOUTH EVERY DAY IN THE EVENING (Patient not taking: Reported on 01/12/2022) 90 tablet 2   No current facility-administered medications on file prior to visit.    Social History   Socioeconomic History   Marital status: Widowed    Spouse name: Not on file   Number of children: 3   Years of education: 12   Highest education level: High school graduate  Occupational History   Occupation: retired  Tobacco Use   Smoking status: Former    Years: 40.00    Types: Cigarettes    Quit date: 05/14/2018    Years since quitting: 3.6   Smokeless tobacco: Never  Vaping Use   Vaping Use: Never used  Substance and Sexual Activity   Alcohol use: No   Drug use: No   Sexual activity: Not Currently    Birth control/protection: Surgical  Other Topics Concern   Not on file  Social History Narrative   Not on file   Social Determinants of Health   Financial Resource Strain: Low Risk  (09/06/2019)   Overall Financial Resource Strain (CARDIA)  Difficulty of Paying Living Expenses: Not hard at all  Food Insecurity: No Food Insecurity (09/06/2019)   Hunger Vital Sign    Worried About Running Out of Food in the Last Year: Never true    Ran Out of Food in the Last Year: Never true  Transportation Needs: No Transportation Needs (09/06/2019)   PRAPARE - Administrator, Civil Service (Medical): No    Lack of Transportation (Non-Medical): No  Physical Activity: Insufficiently Active (09/06/2019)   Exercise Vital Sign    Days of Exercise per Week: 7 days    Minutes of Exercise per Session: 10 min  Stress: No Stress Concern Present (09/06/2019)   [employer] of Occupational Health - Occupational Stress Questionnaire    Feeling of Stress : Not at all  Social Connections: Socially Isolated  (09/06/2019)   Social Connection and Isolation Panel [NHANES]    Frequency of Communication with Friends and Family: More than three times a week    Frequency of Social Gatherings with Friends and Family: More than three times a week    Attends Religious Services: Never    Database administrator or Organizations: No    Attends Banker Meetings: Never    Marital Status: Widowed  Intimate Partner Violence: Not At Risk (09/06/2019)   Humiliation, Afraid, Rape, and Kick questionnaire    Fear of Current or Ex-Partner: No    Emotionally Abused: No    Physically Abused: No    Sexually Abused: No    Family History  Problem Relation Age of Onset   Heart attack Mother    Arthritis Mother    Heart attack Father    Diabetes Brother    Stroke Brother    Breast cancer Daughter     BP 139/73   Pulse 82   Ht 5\' 2"  (1.575 m)   Wt 100 lb (45.4 kg)   BMI 18.29 kg/m   Body mass index is 18.29 kg/m.      Objective:   Physical Exam Vitals and nursing note reviewed. Exam conducted with a chaperone present.  Constitutional:      Appearance: She is well-developed.  HENT:     Head: Normocephalic and atraumatic.  Eyes:     Conjunctiva/sclera: Conjunctivae normal.     Pupils: Pupils are equal, round, and reactive to light.  Cardiovascular:     Rate and Rhythm: Normal rate and regular rhythm.  Pulmonary:     Effort: Pulmonary effort is normal.  Abdominal:     Palpations: Abdomen is soft.  Musculoskeletal:     Cervical back: Normal range of motion and neck supple.       Legs:  Skin:    General: Skin is warm and dry.  Neurological:     Mental Status: She is alert and oriented to person, place, and time.     Cranial Nerves: No cranial nerve deficit.     Motor: No abnormal muscle tone.     Coordination: Coordination normal.     Deep Tendon Reflexes: Reflexes are normal and symmetric. Reflexes normal.  Psychiatric:        Behavior: Behavior normal.        Thought Content:  Thought content normal.        Judgment: Judgment normal.   X-rays were done of the pelvis, reported separately.        Assessment & Plan:   Encounter Diagnosis  Name Primary?   Closed stable fracture of multiple pubic rami (  HCC) Yes   I will see in one month.  Continue wheelchair.  X-rays on return.  Call if any problem.  Precautions discussed.  Electronically Signed Darreld Mclean, MD 7/19/202310:10 AM

## 2022-01-18 DIAGNOSIS — R531 Weakness: Secondary | ICD-10-CM | POA: Diagnosis not present

## 2022-01-18 DIAGNOSIS — M1990 Unspecified osteoarthritis, unspecified site: Secondary | ICD-10-CM | POA: Diagnosis not present

## 2022-01-18 DIAGNOSIS — R413 Other amnesia: Secondary | ICD-10-CM | POA: Diagnosis not present

## 2022-01-18 DIAGNOSIS — E782 Mixed hyperlipidemia: Secondary | ICD-10-CM | POA: Diagnosis not present

## 2022-01-18 DIAGNOSIS — I2510 Atherosclerotic heart disease of native coronary artery without angina pectoris: Secondary | ICD-10-CM | POA: Diagnosis not present

## 2022-01-18 DIAGNOSIS — D508 Other iron deficiency anemias: Secondary | ICD-10-CM | POA: Diagnosis not present

## 2022-01-21 DIAGNOSIS — R011 Cardiac murmur, unspecified: Secondary | ICD-10-CM | POA: Diagnosis not present

## 2022-02-01 DIAGNOSIS — R0989 Other specified symptoms and signs involving the circulatory and respiratory systems: Secondary | ICD-10-CM | POA: Diagnosis not present

## 2022-02-01 DIAGNOSIS — I6523 Occlusion and stenosis of bilateral carotid arteries: Secondary | ICD-10-CM | POA: Diagnosis not present

## 2022-02-02 ENCOUNTER — Ambulatory Visit: Payer: Self-pay | Admitting: *Deleted

## 2022-02-02 DIAGNOSIS — E782 Mixed hyperlipidemia: Secondary | ICD-10-CM | POA: Diagnosis not present

## 2022-02-02 DIAGNOSIS — E119 Type 2 diabetes mellitus without complications: Secondary | ICD-10-CM | POA: Diagnosis not present

## 2022-02-02 DIAGNOSIS — D508 Other iron deficiency anemias: Secondary | ICD-10-CM | POA: Diagnosis not present

## 2022-02-02 DIAGNOSIS — E559 Vitamin D deficiency, unspecified: Secondary | ICD-10-CM | POA: Diagnosis not present

## 2022-02-02 DIAGNOSIS — E038 Other specified hypothyroidism: Secondary | ICD-10-CM | POA: Diagnosis not present

## 2022-02-02 DIAGNOSIS — M1990 Unspecified osteoarthritis, unspecified site: Secondary | ICD-10-CM | POA: Diagnosis not present

## 2022-02-02 DIAGNOSIS — D518 Other vitamin B12 deficiency anemias: Secondary | ICD-10-CM | POA: Diagnosis not present

## 2022-02-02 DIAGNOSIS — I2510 Atherosclerotic heart disease of native coronary artery without angina pectoris: Secondary | ICD-10-CM | POA: Diagnosis not present

## 2022-02-02 DIAGNOSIS — E781 Pure hyperglyceridemia: Secondary | ICD-10-CM | POA: Diagnosis not present

## 2022-02-02 DIAGNOSIS — E785 Hyperlipidemia, unspecified: Secondary | ICD-10-CM | POA: Diagnosis not present

## 2022-02-02 NOTE — Patient Instructions (Signed)
Eileen Kidd  At some point during the past 4 years, I have worked with you through the Chronic Care Management Program (CCM) at [facility]. We have not worked together within the past 6 months.   Due to program changes I am removing myself from your care team.   If you are currently active with another CCM Team Member, you will remain active with them unless they reach out to you with additional information.   If you feel that you need services in the future,  please talk with your primary care provider and request a new referral for Care Management or Care Coordination services. This does not affect your status as a patient at [facility].   Thank you for allowing me to participate in your your healthcare journey.  Eileen Kidd, BSN, RN-BC Engineer, materials Dial: [phone]

## 2022-02-02 NOTE — Chronic Care Management (AMB) (Signed)
  Chronic Care Management   Note  02/02/2022 Name: [name] MRN: [id number] DOB: 1940/11/10   Due to changes in the Chronic Care Management program, I am removing myself as the RN Care Manager from the Care Team and closing any RN Care Management Care Plans. The patient has not worked with the Medical illustrator within the past 6 months. Patient was not scheduled to be followed by the RN Care Coordination nurse for [facility].   Patient does not have an open Care Plan with another CCM team member. Patient does not have a current CCM referral placed since 10/25/21. CCM enrollment status changed to "not enrolled".   Patient's PCP can place a new referral if the they needs Care Management or Care Coordination services in the future.  Demetrios Loll, BSN, RN-BC Engineer, materials Dial: [phone]

## 2022-02-09 ENCOUNTER — Ambulatory Visit (INDEPENDENT_AMBULATORY_CARE_PROVIDER_SITE_OTHER): Payer: Medicare Other

## 2022-02-09 ENCOUNTER — Ambulatory Visit (INDEPENDENT_AMBULATORY_CARE_PROVIDER_SITE_OTHER): Payer: Medicare Other | Admitting: Orthopaedic Surgery

## 2022-02-09 ENCOUNTER — Encounter: Payer: Self-pay | Admitting: Orthopaedic Surgery

## 2022-02-09 DIAGNOSIS — S32599A Other specified fracture of unspecified pubis, initial encounter for closed fracture: Secondary | ICD-10-CM

## 2022-02-09 DIAGNOSIS — I70223 Atherosclerosis of native arteries of extremities with rest pain, bilateral legs: Secondary | ICD-10-CM | POA: Diagnosis not present

## 2022-02-09 NOTE — Progress Notes (Signed)
I feel ok  She is doing well.  She has no hip pain.  X-rays of pelvis done, reported separately.  Encounter Diagnosis  Name Primary?   Closed stable fracture of multiple pubic rami (HCC) Yes   Discharge.  Forms for nursing home completed.  Call if any problem.  Precautions discussed.  Electronically Signed Darreld Mclean, MD 8/16/20239:51 AM

## 2022-02-22 DIAGNOSIS — M1990 Unspecified osteoarthritis, unspecified site: Secondary | ICD-10-CM | POA: Diagnosis not present

## 2022-02-22 DIAGNOSIS — R413 Other amnesia: Secondary | ICD-10-CM | POA: Diagnosis not present

## 2022-02-22 DIAGNOSIS — I2510 Atherosclerotic heart disease of native coronary artery without angina pectoris: Secondary | ICD-10-CM | POA: Diagnosis not present

## 2022-02-22 DIAGNOSIS — E782 Mixed hyperlipidemia: Secondary | ICD-10-CM | POA: Diagnosis not present

## 2022-02-22 DIAGNOSIS — R531 Weakness: Secondary | ICD-10-CM | POA: Diagnosis not present

## 2022-02-22 DIAGNOSIS — R011 Cardiac murmur, unspecified: Secondary | ICD-10-CM | POA: Diagnosis not present

## 2022-02-22 DIAGNOSIS — H9190 Unspecified hearing loss, unspecified ear: Secondary | ICD-10-CM | POA: Diagnosis not present

## 2022-02-22 DIAGNOSIS — D508 Other iron deficiency anemias: Secondary | ICD-10-CM | POA: Diagnosis not present

## 2022-02-25 DIAGNOSIS — I77811 Abdominal aortic ectasia: Secondary | ICD-10-CM | POA: Diagnosis not present

## 2022-03-03 DIAGNOSIS — D508 Other iron deficiency anemias: Secondary | ICD-10-CM | POA: Diagnosis not present

## 2022-03-03 DIAGNOSIS — E559 Vitamin D deficiency, unspecified: Secondary | ICD-10-CM | POA: Diagnosis not present

## 2022-03-03 DIAGNOSIS — E038 Other specified hypothyroidism: Secondary | ICD-10-CM | POA: Diagnosis not present

## 2022-03-03 DIAGNOSIS — E119 Type 2 diabetes mellitus without complications: Secondary | ICD-10-CM | POA: Diagnosis not present

## 2022-03-03 DIAGNOSIS — E785 Hyperlipidemia, unspecified: Secondary | ICD-10-CM | POA: Diagnosis not present

## 2022-03-03 DIAGNOSIS — M1990 Unspecified osteoarthritis, unspecified site: Secondary | ICD-10-CM | POA: Diagnosis not present

## 2022-03-03 DIAGNOSIS — E782 Mixed hyperlipidemia: Secondary | ICD-10-CM | POA: Diagnosis not present

## 2022-03-03 DIAGNOSIS — I2510 Atherosclerotic heart disease of native coronary artery without angina pectoris: Secondary | ICD-10-CM | POA: Diagnosis not present

## 2022-03-03 DIAGNOSIS — D518 Other vitamin B12 deficiency anemias: Secondary | ICD-10-CM | POA: Diagnosis not present

## 2022-03-03 DIAGNOSIS — E781 Pure hyperglyceridemia: Secondary | ICD-10-CM | POA: Diagnosis not present

## 2022-03-07 DIAGNOSIS — E782 Mixed hyperlipidemia: Secondary | ICD-10-CM | POA: Diagnosis not present

## 2022-03-07 DIAGNOSIS — E119 Type 2 diabetes mellitus without complications: Secondary | ICD-10-CM | POA: Diagnosis not present

## 2022-03-07 DIAGNOSIS — E038 Other specified hypothyroidism: Secondary | ICD-10-CM | POA: Diagnosis not present

## 2022-03-07 DIAGNOSIS — E559 Vitamin D deficiency, unspecified: Secondary | ICD-10-CM | POA: Diagnosis not present

## 2022-03-07 DIAGNOSIS — D518 Other vitamin B12 deficiency anemias: Secondary | ICD-10-CM | POA: Diagnosis not present

## 2022-03-07 DIAGNOSIS — Z79899 Other long term (current) drug therapy: Secondary | ICD-10-CM | POA: Diagnosis not present

## 2022-03-22 DIAGNOSIS — I2510 Atherosclerotic heart disease of native coronary artery without angina pectoris: Secondary | ICD-10-CM | POA: Diagnosis not present

## 2022-03-22 DIAGNOSIS — D508 Other iron deficiency anemias: Secondary | ICD-10-CM | POA: Diagnosis not present

## 2022-03-22 DIAGNOSIS — R011 Cardiac murmur, unspecified: Secondary | ICD-10-CM | POA: Diagnosis not present

## 2022-03-22 DIAGNOSIS — R531 Weakness: Secondary | ICD-10-CM | POA: Diagnosis not present

## 2022-03-22 DIAGNOSIS — R059 Cough, unspecified: Secondary | ICD-10-CM | POA: Diagnosis not present

## 2022-03-22 DIAGNOSIS — M1990 Unspecified osteoarthritis, unspecified site: Secondary | ICD-10-CM | POA: Diagnosis not present

## 2022-03-22 DIAGNOSIS — H9190 Unspecified hearing loss, unspecified ear: Secondary | ICD-10-CM | POA: Diagnosis not present

## 2022-03-22 DIAGNOSIS — E782 Mixed hyperlipidemia: Secondary | ICD-10-CM | POA: Diagnosis not present

## 2022-03-22 DIAGNOSIS — R413 Other amnesia: Secondary | ICD-10-CM | POA: Diagnosis not present

## 2022-03-24 DIAGNOSIS — I10 Essential (primary) hypertension: Secondary | ICD-10-CM | POA: Diagnosis not present

## 2022-04-04 DIAGNOSIS — E782 Mixed hyperlipidemia: Secondary | ICD-10-CM | POA: Diagnosis not present

## 2022-04-04 DIAGNOSIS — D508 Other iron deficiency anemias: Secondary | ICD-10-CM | POA: Diagnosis not present

## 2022-04-04 DIAGNOSIS — E559 Vitamin D deficiency, unspecified: Secondary | ICD-10-CM | POA: Diagnosis not present

## 2022-04-04 DIAGNOSIS — I2510 Atherosclerotic heart disease of native coronary artery without angina pectoris: Secondary | ICD-10-CM | POA: Diagnosis not present

## 2022-04-04 DIAGNOSIS — E119 Type 2 diabetes mellitus without complications: Secondary | ICD-10-CM | POA: Diagnosis not present

## 2022-04-04 DIAGNOSIS — E781 Pure hyperglyceridemia: Secondary | ICD-10-CM | POA: Diagnosis not present

## 2022-04-04 DIAGNOSIS — D518 Other vitamin B12 deficiency anemias: Secondary | ICD-10-CM | POA: Diagnosis not present

## 2022-04-04 DIAGNOSIS — M1990 Unspecified osteoarthritis, unspecified site: Secondary | ICD-10-CM | POA: Diagnosis not present

## 2022-04-04 DIAGNOSIS — E785 Hyperlipidemia, unspecified: Secondary | ICD-10-CM | POA: Diagnosis not present

## 2022-04-04 DIAGNOSIS — E038 Other specified hypothyroidism: Secondary | ICD-10-CM | POA: Diagnosis not present

## 2022-04-23 DIAGNOSIS — I10 Essential (primary) hypertension: Secondary | ICD-10-CM | POA: Diagnosis not present

## 2022-04-26 DIAGNOSIS — H9190 Unspecified hearing loss, unspecified ear: Secondary | ICD-10-CM | POA: Diagnosis not present

## 2022-04-26 DIAGNOSIS — D508 Other iron deficiency anemias: Secondary | ICD-10-CM | POA: Diagnosis not present

## 2022-04-26 DIAGNOSIS — I2510 Atherosclerotic heart disease of native coronary artery without angina pectoris: Secondary | ICD-10-CM | POA: Diagnosis not present

## 2022-04-26 DIAGNOSIS — M1990 Unspecified osteoarthritis, unspecified site: Secondary | ICD-10-CM | POA: Diagnosis not present

## 2022-04-26 DIAGNOSIS — R011 Cardiac murmur, unspecified: Secondary | ICD-10-CM | POA: Diagnosis not present

## 2022-04-26 DIAGNOSIS — E782 Mixed hyperlipidemia: Secondary | ICD-10-CM | POA: Diagnosis not present

## 2022-04-26 DIAGNOSIS — R531 Weakness: Secondary | ICD-10-CM | POA: Diagnosis not present

## 2022-05-02 DIAGNOSIS — L600 Ingrowing nail: Secondary | ICD-10-CM | POA: Diagnosis not present

## 2022-05-02 DIAGNOSIS — B351 Tinea unguium: Secondary | ICD-10-CM | POA: Diagnosis not present

## 2022-05-02 DIAGNOSIS — M79672 Pain in left foot: Secondary | ICD-10-CM | POA: Diagnosis not present

## 2022-05-02 DIAGNOSIS — M79671 Pain in right foot: Secondary | ICD-10-CM | POA: Diagnosis not present

## 2022-05-04 DIAGNOSIS — D518 Other vitamin B12 deficiency anemias: Secondary | ICD-10-CM | POA: Diagnosis not present

## 2022-05-04 DIAGNOSIS — E119 Type 2 diabetes mellitus without complications: Secondary | ICD-10-CM | POA: Diagnosis not present

## 2022-05-04 DIAGNOSIS — E785 Hyperlipidemia, unspecified: Secondary | ICD-10-CM | POA: Diagnosis not present

## 2022-05-04 DIAGNOSIS — I2510 Atherosclerotic heart disease of native coronary artery without angina pectoris: Secondary | ICD-10-CM | POA: Diagnosis not present

## 2022-05-04 DIAGNOSIS — E782 Mixed hyperlipidemia: Secondary | ICD-10-CM | POA: Diagnosis not present

## 2022-05-04 DIAGNOSIS — E559 Vitamin D deficiency, unspecified: Secondary | ICD-10-CM | POA: Diagnosis not present

## 2022-05-04 DIAGNOSIS — D508 Other iron deficiency anemias: Secondary | ICD-10-CM | POA: Diagnosis not present

## 2022-05-04 DIAGNOSIS — E038 Other specified hypothyroidism: Secondary | ICD-10-CM | POA: Diagnosis not present

## 2022-05-04 DIAGNOSIS — M1990 Unspecified osteoarthritis, unspecified site: Secondary | ICD-10-CM | POA: Diagnosis not present

## 2022-05-04 DIAGNOSIS — E781 Pure hyperglyceridemia: Secondary | ICD-10-CM | POA: Diagnosis not present

## 2022-05-24 DIAGNOSIS — I10 Essential (primary) hypertension: Secondary | ICD-10-CM | POA: Diagnosis not present

## 2022-05-31 DIAGNOSIS — R011 Cardiac murmur, unspecified: Secondary | ICD-10-CM | POA: Diagnosis not present

## 2022-05-31 DIAGNOSIS — R413 Other amnesia: Secondary | ICD-10-CM | POA: Diagnosis not present

## 2022-05-31 DIAGNOSIS — R531 Weakness: Secondary | ICD-10-CM | POA: Diagnosis not present

## 2022-05-31 DIAGNOSIS — E782 Mixed hyperlipidemia: Secondary | ICD-10-CM | POA: Diagnosis not present

## 2022-05-31 DIAGNOSIS — I2510 Atherosclerotic heart disease of native coronary artery without angina pectoris: Secondary | ICD-10-CM | POA: Diagnosis not present

## 2022-06-16 DIAGNOSIS — E782 Mixed hyperlipidemia: Secondary | ICD-10-CM | POA: Diagnosis not present

## 2022-06-16 DIAGNOSIS — E119 Type 2 diabetes mellitus without complications: Secondary | ICD-10-CM | POA: Diagnosis not present

## 2022-06-16 DIAGNOSIS — D518 Other vitamin B12 deficiency anemias: Secondary | ICD-10-CM | POA: Diagnosis not present

## 2022-06-16 DIAGNOSIS — E038 Other specified hypothyroidism: Secondary | ICD-10-CM | POA: Diagnosis not present

## 2022-06-16 DIAGNOSIS — Z79899 Other long term (current) drug therapy: Secondary | ICD-10-CM | POA: Diagnosis not present

## 2022-06-22 DIAGNOSIS — E785 Hyperlipidemia, unspecified: Secondary | ICD-10-CM | POA: Diagnosis not present

## 2022-06-22 DIAGNOSIS — I2510 Atherosclerotic heart disease of native coronary artery without angina pectoris: Secondary | ICD-10-CM | POA: Diagnosis not present

## 2022-06-22 DIAGNOSIS — E119 Type 2 diabetes mellitus without complications: Secondary | ICD-10-CM | POA: Diagnosis not present

## 2022-06-22 DIAGNOSIS — M1990 Unspecified osteoarthritis, unspecified site: Secondary | ICD-10-CM | POA: Diagnosis not present

## 2022-06-22 DIAGNOSIS — E782 Mixed hyperlipidemia: Secondary | ICD-10-CM | POA: Diagnosis not present

## 2022-06-22 DIAGNOSIS — E038 Other specified hypothyroidism: Secondary | ICD-10-CM | POA: Diagnosis not present

## 2022-06-22 DIAGNOSIS — D518 Other vitamin B12 deficiency anemias: Secondary | ICD-10-CM | POA: Diagnosis not present

## 2022-06-22 DIAGNOSIS — E781 Pure hyperglyceridemia: Secondary | ICD-10-CM | POA: Diagnosis not present

## 2022-06-22 DIAGNOSIS — D508 Other iron deficiency anemias: Secondary | ICD-10-CM | POA: Diagnosis not present

## 2022-06-22 DIAGNOSIS — E559 Vitamin D deficiency, unspecified: Secondary | ICD-10-CM | POA: Diagnosis not present

## 2022-06-23 DIAGNOSIS — I10 Essential (primary) hypertension: Secondary | ICD-10-CM | POA: Diagnosis not present

## 2022-07-05 DIAGNOSIS — R011 Cardiac murmur, unspecified: Secondary | ICD-10-CM | POA: Diagnosis not present

## 2022-07-05 DIAGNOSIS — R0602 Shortness of breath: Secondary | ICD-10-CM | POA: Diagnosis not present

## 2022-07-05 DIAGNOSIS — R059 Cough, unspecified: Secondary | ICD-10-CM | POA: Diagnosis not present

## 2022-07-05 DIAGNOSIS — M1990 Unspecified osteoarthritis, unspecified site: Secondary | ICD-10-CM | POA: Diagnosis not present

## 2022-07-05 DIAGNOSIS — I2510 Atherosclerotic heart disease of native coronary artery without angina pectoris: Secondary | ICD-10-CM | POA: Diagnosis not present

## 2022-07-05 DIAGNOSIS — R531 Weakness: Secondary | ICD-10-CM | POA: Diagnosis not present

## 2022-07-05 DIAGNOSIS — E782 Mixed hyperlipidemia: Secondary | ICD-10-CM | POA: Diagnosis not present

## 2022-07-18 DIAGNOSIS — D508 Other iron deficiency anemias: Secondary | ICD-10-CM | POA: Diagnosis not present

## 2022-07-18 DIAGNOSIS — E119 Type 2 diabetes mellitus without complications: Secondary | ICD-10-CM | POA: Diagnosis not present

## 2022-07-18 DIAGNOSIS — E038 Other specified hypothyroidism: Secondary | ICD-10-CM | POA: Diagnosis not present

## 2022-07-18 DIAGNOSIS — M1990 Unspecified osteoarthritis, unspecified site: Secondary | ICD-10-CM | POA: Diagnosis not present

## 2022-07-18 DIAGNOSIS — D518 Other vitamin B12 deficiency anemias: Secondary | ICD-10-CM | POA: Diagnosis not present

## 2022-07-18 DIAGNOSIS — E781 Pure hyperglyceridemia: Secondary | ICD-10-CM | POA: Diagnosis not present

## 2022-07-18 DIAGNOSIS — E785 Hyperlipidemia, unspecified: Secondary | ICD-10-CM | POA: Diagnosis not present

## 2022-07-18 DIAGNOSIS — I2510 Atherosclerotic heart disease of native coronary artery without angina pectoris: Secondary | ICD-10-CM | POA: Diagnosis not present

## 2022-07-18 DIAGNOSIS — E559 Vitamin D deficiency, unspecified: Secondary | ICD-10-CM | POA: Diagnosis not present

## 2022-07-18 DIAGNOSIS — E782 Mixed hyperlipidemia: Secondary | ICD-10-CM | POA: Diagnosis not present

## 2022-07-24 DIAGNOSIS — I10 Essential (primary) hypertension: Secondary | ICD-10-CM | POA: Diagnosis not present

## 2022-08-25 ENCOUNTER — Encounter: Payer: Self-pay | Admitting: Radiology

## 2022-12-03 ENCOUNTER — Emergency Department (HOSPITAL_COMMUNITY): Payer: 59

## 2022-12-03 ENCOUNTER — Encounter (HOSPITAL_COMMUNITY): Payer: Self-pay

## 2022-12-03 ENCOUNTER — Emergency Department (HOSPITAL_COMMUNITY)
Admission: EM | Admit: 2022-12-03 | Discharge: 2022-12-04 | Disposition: A | Discharge status: Home / Self Care | Payer: 59 | Attending: Emergency Medicine | Admitting: Emergency Medicine

## 2022-12-03 ENCOUNTER — Other Ambulatory Visit: Payer: Self-pay

## 2022-12-03 DIAGNOSIS — S32599A Other specified fracture of unspecified pubis, initial encounter for closed fracture: Secondary | ICD-10-CM

## 2022-12-03 DIAGNOSIS — W06XXXA Fall from bed, initial encounter: Secondary | ICD-10-CM | POA: Insufficient documentation

## 2022-12-03 DIAGNOSIS — R519 Headache, unspecified: Secondary | ICD-10-CM | POA: Insufficient documentation

## 2022-12-03 DIAGNOSIS — Z7982 Long term (current) use of aspirin: Secondary | ICD-10-CM | POA: Insufficient documentation

## 2022-12-03 DIAGNOSIS — M542 Cervicalgia: Secondary | ICD-10-CM | POA: Diagnosis not present

## 2022-12-03 DIAGNOSIS — S3992XA Unspecified injury of lower back, initial encounter: Secondary | ICD-10-CM | POA: Diagnosis present

## 2022-12-03 DIAGNOSIS — S32501A Unspecified fracture of right pubis, initial encounter for closed fracture: Secondary | ICD-10-CM | POA: Diagnosis not present

## 2022-12-03 LAB — URINALYSIS, ROUTINE W REFLEX MICROSCOPIC
Bacteria, UA: NONE SEEN
Bilirubin Urine: NEGATIVE
Glucose, UA: NEGATIVE mg/dL
Hgb urine dipstick: NEGATIVE
Ketones, ur: NEGATIVE mg/dL
Leukocytes,Ua: NEGATIVE
Nitrite: NEGATIVE
Protein, ur: 30 mg/dL — AB
Specific Gravity, Urine: 1.015 (ref 1.005–1.030)
pH: 6 (ref 5.0–8.0)

## 2022-12-03 MED ORDER — NICOTINE 21 MG/24HR TD PT24
21.0000 mg | MEDICATED_PATCH | Freq: Once | TRANSDERMAL | Status: DC
  Administered 2022-12-03: 21 mg via TRANSDERMAL
  Filled 2022-12-03: qty 1

## 2022-12-03 NOTE — ED Provider Notes (Cosign Needed Addendum)
[place] EMERGENCY DEPARTMENT AT [facility] Provider Note   CSN: [id number] Arrival date & time: 12/03/22  1356     History  Chief Complaint  Patient presents with   Head Injury    Eileen Kidd is a 82 y.o. female with a history significant for osteoporosis, prior history of right hip fracture, hyperlipidemia, presenting from a local nursing home where she rolled out of bed and hit her right eyebrow region on the floor.  Patient denies any pain including headache pain, facial pain, neck, chest or back pain.  She also denies nausea or vomiting, dizziness or pain in her extremities.  She has had no treatment prior to arrival.  The history is provided by the patient.       Home Medications Prior to Admission medications   Medication Sig Start Date End Date Taking? Authorizing Provider  acetaminophen (TYLENOL) 500 MG tablet Take 500 mg by mouth every 6 (six) hours as needed.    [provider]  aspirin 81 MG chewable tablet Chew 81 mg by mouth daily.    [provider]  ferrous sulfate 325 (65 FE) MG tablet Take 325 mg by mouth daily with breakfast.    [provider]  galantamine (RAZADYNE) 4 MG tablet Take 4 mg by mouth daily. 02/03/22   [provider]  memantine (NAMENDA) 10 MG tablet Take 10 mg by mouth daily. 02/03/22   [provider]  Multiple Vitamins-Minerals (MULTIVITAMIN & MINERAL PO) Take 1 tablet by mouth daily.     [provider]  potassium chloride (KLOR-CON M) 10 MEQ tablet Take 10 mEq by mouth daily.    [provider]  rosuvastatin (CRESTOR) 40 MG tablet Take 40 mg by mouth daily.    [provider]  traMADol (ULTRAM) 50 MG tablet Take 50 mg by mouth every 8 (eight) hours as needed (pain).    [provider]      Allergies    Codeine and Alendronate    Review of Systems   Review of Systems  Constitutional:  Negative for fever.  HENT:  Negative for congestion.   Eyes:  Negative.   Respiratory:  Negative for chest tightness and shortness of breath.   Cardiovascular:  Negative for chest pain.  Gastrointestinal:  Negative for abdominal pain, nausea and vomiting.  Genitourinary: Negative.   Musculoskeletal:  Negative for arthralgias, joint swelling and neck pain.  Skin: Negative.  Negative for rash and wound.  Neurological:  Negative for dizziness, weakness, light-headedness, numbness and headaches.  Psychiatric/Behavioral: Negative.    All other systems reviewed and are negative.   Physical Exam Updated Vital Signs BP (!) 128/99 (BP Location: Left Arm)   Pulse 70   Temp 97.9 F (36.6 C) (Oral)   Resp 17   Ht 5\' 2"  (1.575 m)   Wt 45 kg   SpO2 97%   BMI 18.15 kg/m  Physical Exam Vitals and nursing note reviewed.  Constitutional:      Appearance: Normal appearance. She is well-developed.  HENT:     Head: Normocephalic and atraumatic.     Comments: No obvious facial injury/hematoma.  There is a subtle area of hyperpigmentation above her right brow, this may be an early developing bruise. Eyes:     Extraocular Movements: Extraocular movements intact.     Conjunctiva/sclera: Conjunctivae normal.     Pupils: Pupils are equal, round, and reactive to light.  Cardiovascular:     Rate and Rhythm: Normal rate  and regular rhythm.     Heart sounds: Normal heart sounds.  Pulmonary:     Effort: Pulmonary effort is normal.     Breath sounds: Normal breath sounds. No wheezing.  Abdominal:     General: Bowel sounds are normal.     Palpations: Abdomen is soft.     Tenderness: There is no abdominal tenderness. There is no guarding.  Musculoskeletal:        General: Tenderness present. Normal range of motion.     Cervical back: Normal range of motion.     Right hip: Tenderness present. No deformity.     Left hip: No crepitus.     Comments: Patient grimaces with range of motion of her right leg, points to her right groin as the location of pain.  No leg  shortening or eversion.  Skin:    General: Skin is warm and dry.  Neurological:     Mental Status: She is alert.     ED Results / Procedures / Treatments   Labs (all labs ordered are listed, but only abnormal results are displayed) Labs Reviewed  URINALYSIS, ROUTINE W REFLEX MICROSCOPIC - Abnormal; Notable for the following components:      Result Value   APPearance HAZY (*)    Protein, ur 30 (*)    All other components within normal limits    EKG None  Radiology CT PELVIS WO CONTRAST  Result Date: 12/03/2022 CLINICAL DATA:  Pain after fall EXAM: CT PELVIS WITHOUT CONTRAST TECHNIQUE: Multidetector CT imaging of the pelvis was performed following the standard protocol without intravenous contrast. RADIATION DOSE REDUCTION: This exam was performed according to the departmental dose-optimization program which includes automated exposure control, adjustment of the mA and/or kV according to patient size and/or use of iterative reconstruction technique. COMPARISON:  X-ray earlier 12/03/2018 FINDINGS: Urinary Tract:  Preserved contours of the urinary bladder. Bowel: Moderate stool in the visualized colon including the rectum. Redundant course of the sigmoid colon with some diverticula. Small bowel is nondilated. Vascular/Lymphatic: Diffuse vascular calcifications along the visualized aorta and iliac vessels. No specific abnormal lymph node enlargement identified. Reproductive:  No mass or other significant abnormality Other: No free fluid. Small fat containing inguinal hernias. Of note the arms were scanned over the pelvis causing some streak artifact. Musculoskeletal: Severe osteopenia. Moderate degenerative changes of the visualized lower lumbar spine with some disc bulging. Surgical changes at L5 from laminectomy. Degenerative changes are also seen along the sacroiliac joints and hip joints with joint space loss. There is streak artifact related to the dynamic right hip screw along intramedullary  rod which is incompletely included in the imaging field. No obvious hardware failure of the visualized portions of the prosthesis. Chondrocalcinosis. There is chronic appearing deformity of the left inferior pubic ramus consistent with old injury. However there is a an acute subtle cortical irregularity along the anterior aspect of the right acetabulum along the margin of the superior pubic ramus as seen on axial series 5, image 82 consistent with an acute injury. There is also a subtle fracture of the inferior pubic ramus near the margin of the pubic symphysis as seen on axial image 97 with irregular posterior cortex. IMPRESSION: Subtle acute fractures of the right superior and inferior pubic rami. The superior pubic ramus fracture is along the margin of the anterior right-sided acetabulum. Chronic injuries involving the right femur and left inferior pubic ramus. Severe osteopenia with degenerative changes. Electronically Signed   By: Piedad Climes.D.  On: 12/03/2022 16:25   DG Hip Unilat W or Wo Pelvis 2-3 Views Right  Result Date: 12/03/2022 CLINICAL DATA:  Fall EXAM: DG HIP (WITH OR WITHOUT PELVIS) 2-3V LEFT; DG HIP (WITH OR WITHOUT PELVIS) 2-3V RIGHT COMPARISON:  XR 08/18/2018 and 02/09/2022. FINDINGS: Severe osteopenia. Severe osteopenia. Subtle deformity of the inferior pubic rami bilaterally in the right superior pubic ramus. The left focus could be old wall of the right-sided foci could be new. Mild joint space loss of the hip joints and sacroiliac joints. There is dynamic right hip screw in place, stable from previous. The femoral component is not completely included in the imaging field. Curvature and degenerative changes of the spine. Diffuse colonic stool. IMPRESSION: Old injury involving the right femur with dynamic hip screw. Severe osteopenia. Subtle deformities involving the right pubic bone and left inferior pubic ramus are seen. The left focus may be old wall of the right foci appear to be new.  Overall with this level of osteopenia if there is further concern a subtle injury, cross-sectional imaging may be of some benefit for further sensitivity. Electronically Signed   By: Karen Kays M.D.   On: 12/03/2022 15:20   DG Hip Unilat W or Wo Pelvis 2-3 Views Left  Result Date: 12/03/2022 CLINICAL DATA:  Fall EXAM: DG HIP (WITH OR WITHOUT PELVIS) 2-3V LEFT; DG HIP (WITH OR WITHOUT PELVIS) 2-3V RIGHT COMPARISON:  XR 08/18/2018 and 02/09/2022. FINDINGS: Severe osteopenia. Severe osteopenia. Subtle deformity of the inferior pubic rami bilaterally in the right superior pubic ramus. The left focus could be old wall of the right-sided foci could be new. Mild joint space loss of the hip joints and sacroiliac joints. There is dynamic right hip screw in place, stable from previous. The femoral component is not completely included in the imaging field. Curvature and degenerative changes of the spine. Diffuse colonic stool. IMPRESSION: Old injury involving the right femur with dynamic hip screw. Severe osteopenia. Subtle deformities involving the right pubic bone and left inferior pubic ramus are seen. The left focus may be old wall of the right foci appear to be new. Overall with this level of osteopenia if there is further concern a subtle injury, cross-sectional imaging may be of some benefit for further sensitivity. Electronically Signed   By: Karen Kays M.D.   On: 12/03/2022 15:20   CT Head Wo Contrast  Result Date: 12/03/2022 CLINICAL DATA:  Rolled out of bed, hit right eyebrow EXAM: CT HEAD WITHOUT CONTRAST CT CERVICAL SPINE WITHOUT CONTRAST TECHNIQUE: Multidetector CT imaging of the head and cervical spine was performed following the standard protocol without intravenous contrast. Multiplanar CT image reconstructions of the cervical spine were also generated. RADIATION DOSE REDUCTION: This exam was performed according to the departmental dose-optimization program which includes automated exposure control,  adjustment of the mA and/or kV according to patient size and/or use of iterative reconstruction technique. COMPARISON:  07/28/2017 FINDINGS: CT HEAD FINDINGS Brain: No evidence of acute infarction, hemorrhage, hydrocephalus, extra-axial collection or mass lesion/mass effect. Periventricular and deep white matter hypodensity. Vascular: No hyperdense vessel or unexpected calcification. Skull: Normal. Negative for fracture or focal lesion. Sinuses/Orbits: No acute finding. Other: None. CT CERVICAL SPINE FINDINGS Alignment: Degenerative straightening and reversal of the normal cervical lordosis. Skull base and vertebrae: No acute fracture. No primary bone lesion or focal pathologic process. Soft tissues and spinal canal: No prevertebral fluid or swelling. No visible canal hematoma. Disc levels: Moderate to severe multilevel disc space height loss and osteophytosis,  worst at the lower cervical levels. Upper chest: Negative. Other: None. IMPRESSION: 1. No acute intracranial pathology. Small-vessel white matter disease. 2. No fracture or static subluxation of the cervical spine. 3. Moderate to severe multilevel cervical disc degenerative disease. Electronically Signed   By: Jearld Lesch M.D.   On: 12/03/2022 14:56   CT Cervical Spine Wo Contrast  Result Date: 12/03/2022 CLINICAL DATA:  Rolled out of bed, hit right eyebrow EXAM: CT HEAD WITHOUT CONTRAST CT CERVICAL SPINE WITHOUT CONTRAST TECHNIQUE: Multidetector CT imaging of the head and cervical spine was performed following the standard protocol without intravenous contrast. Multiplanar CT image reconstructions of the cervical spine were also generated. RADIATION DOSE REDUCTION: This exam was performed according to the departmental dose-optimization program which includes automated exposure control, adjustment of the mA and/or kV according to patient size and/or use of iterative reconstruction technique. COMPARISON:  07/28/2017 FINDINGS: CT HEAD FINDINGS Brain: No  evidence of acute infarction, hemorrhage, hydrocephalus, extra-axial collection or mass lesion/mass effect. Periventricular and deep white matter hypodensity. Vascular: No hyperdense vessel or unexpected calcification. Skull: Normal. Negative for fracture or focal lesion. Sinuses/Orbits: No acute finding. Other: None. CT CERVICAL SPINE FINDINGS Alignment: Degenerative straightening and reversal of the normal cervical lordosis. Skull base and vertebrae: No acute fracture. No primary bone lesion or focal pathologic process. Soft tissues and spinal canal: No prevertebral fluid or swelling. No visible canal hematoma. Disc levels: Moderate to severe multilevel disc space height loss and osteophytosis, worst at the lower cervical levels. Upper chest: Negative. Other: None. IMPRESSION: 1. No acute intracranial pathology. Small-vessel white matter disease. 2. No fracture or static subluxation of the cervical spine. 3. Moderate to severe multilevel cervical disc degenerative disease. Electronically Signed   By: Jearld Lesch M.D.   On: 12/03/2022 14:56    Procedures Procedures    Medications Ordered in ED Medications - No data to display   ED Course/ Medical Decision Making/ A&P                             Medical Decision Making Patient presenting from a local nursing home for injuries sustained in a fall out of bed.  It was a witnessed fall and she hit her forehead on the floor.  Patient has no complaints of pain initially on exam, however with rotation of the right leg she does express some pain in her upper thigh and pelvis region.  Plain film imaging suggesting possible right pubic bone fracture.  Also left inferior pubic ramus.  CT imaging was completed, the left focus is subacute as confirmed by call to the nursing home.  Right inferior and superior pubic ramus fractures appear acute.  Left finding old injury.  Amount and/or Complexity of Data Reviewed Labs: ordered. Radiology: ordered.    Details:  In addition to the above findings, patient has severe osteopenia.  CT head and C-spine are negative for acute findings. Discussion of management or test interpretation with external provider(s): Call placed to the nursing facility, spoke with her RN who confirmed that patient is ambulatory at baseline, generally uses a walker for ambulation.  Also endorses that she has some mild dementia.  We also discussed that patient is not a smoker despite patient's insistence here that she is in therefore she had asked for nicotine patch, initially this was provided, once we confirmed at the nursing home that she does not smoke the patch was removed.  RN states that she  has had worsening confusion and is asked for [place] to check a urinalysis.  UA completed and negative.  Call placed to Dr. Dallas Schimke, confirms weight bearing as tolerated with walker is ok.  Plan recheck appt with him in 1-2 weeks.           Final Clinical Impression(s) / ED Diagnoses Final diagnoses:  Closed fracture of multiple pubic rami, initial encounter [facility])    Rx / DC Orders ED Discharge Orders     None         Victoriano Lain 12/03/22 1825    Burgess Amor, PA-C 12/03/22 Izell Salamonia    Eber Hong, MD 12/04/22 1215

## 2022-12-03 NOTE — ED Notes (Signed)
Pt states she smokes a half pack of cigarettes/day and is requesting a nicotine patch--PA-C made aware

## 2022-12-03 NOTE — ED Notes (Signed)
Patient transported to CT 

## 2022-12-03 NOTE — Discharge Instructions (Signed)
You do have a fracture in your pelvis (your pubic bone) which is a stable injury and you can weight bear as tolerated (if it hurts, then rest). Use your walker at all times for stability with walking.  Take tylenol and your tramadol as needed for pain relief.

## 2022-12-03 NOTE — ED Triage Notes (Signed)
Facility reports pt rolled out of bed and hit her right eyebrow.  Pt reports she feels fine and does not have any pain.

## 2022-12-03 NOTE — ED Notes (Signed)
Pt care taken, no complaints at this time. Waiting on transport to take her to [facility]

## 2022-12-05 ENCOUNTER — Telehealth: Payer: Self-pay

## 2022-12-12 NOTE — Telephone Encounter (Signed)
error 

## 2022-12-12 NOTE — Telephone Encounter (Deleted)
error 

## 2022-12-15 ENCOUNTER — Encounter: Payer: Self-pay | Admitting: Orthopedic Surgery

## 2022-12-15 ENCOUNTER — Other Ambulatory Visit: Payer: Self-pay

## 2022-12-15 ENCOUNTER — Ambulatory Visit (INDEPENDENT_AMBULATORY_CARE_PROVIDER_SITE_OTHER): Payer: 59 | Admitting: Orthopedic Surgery

## 2022-12-15 VITALS — BP 137/62 | HR 81

## 2022-12-15 DIAGNOSIS — S32591A Other specified fracture of right pubis, initial encounter for closed fracture: Secondary | ICD-10-CM

## 2022-12-15 DIAGNOSIS — G8929 Other chronic pain: Secondary | ICD-10-CM | POA: Insufficient documentation

## 2022-12-15 NOTE — Progress Notes (Signed)
Chief Complaint  Patient presents with   Fracture    Pelvic fracture right side       New problem   There is no height or weight on file to calculate BMI.  BP 137/62   Pulse 81     Intake history:  WHAT ARE WE SEEING YOU FOR TODAY?   Right pubic ramus fracture   How long has this bothered you?    12/14/22  Anticoag.  No  Diabetes No  Heart disease No  She can not give a history   Chief Complaint  Patient presents with   Fracture    Pelvic fracture right side     Hpi:  Apparently fell at the Facility   CT scan and plain films show a nd frx right pubic rami   ROS can not take due to mental deficiency   Physical Exam Vitals and nursing note reviewed.  Constitutional:      Appearance: She is underweight. She is not ill-appearing, toxic-appearing or diaphoretic.  HENT:     Head: Normocephalic and atraumatic.  Eyes:     General: No scleral icterus.       Right eye: No discharge.        Left eye: No discharge.     Extraocular Movements: Extraocular movements intact.     Conjunctiva/sclera: Conjunctivae normal.     Pupils: Pupils are equal, round, and reactive to light.  Cardiovascular:     Rate and Rhythm: Normal rate.     Pulses: Normal pulses.  Musculoskeletal:     Comments: Normal pain free rom both hip s and no deformity is seen in the legs   Skin:    General: Skin is warm and dry.     Capillary Refill: Capillary refill takes less than 2 seconds.  Neurological:     General: No focal deficit present.     Mental Status: She is alert.     Sensory: No sensory deficit.     Gait: Gait abnormal.  Psychiatric:        Behavior: Behavior normal.    Imaging that I assessed   [place] films right hip : no ac. Frx   CT : nd frx sup and inf pubic rami right pelvis   Encounter Diagnosis  Name Primary?   Closed fracture of ramus of right pubis, initial encounter (HCC) 12/04/22 Yes     A/P  I dont see any reason to take more films   She can be WBAT  with PT

## 2023-02-08 ENCOUNTER — Encounter (HOSPITAL_COMMUNITY): Payer: Self-pay | Admitting: *Deleted

## 2023-02-08 ENCOUNTER — Emergency Department (HOSPITAL_COMMUNITY)
Admission: EM | Admit: 2023-02-08 | Discharge: 2023-02-08 | Disposition: A | Discharge status: Home / Self Care | Payer: 59 | Attending: Emergency Medicine | Admitting: Emergency Medicine

## 2023-02-08 ENCOUNTER — Other Ambulatory Visit: Payer: Self-pay

## 2023-02-08 DIAGNOSIS — Z7982 Long term (current) use of aspirin: Secondary | ICD-10-CM | POA: Diagnosis not present

## 2023-02-08 DIAGNOSIS — F0390 Unspecified dementia without behavioral disturbance: Secondary | ICD-10-CM | POA: Diagnosis not present

## 2023-02-08 DIAGNOSIS — N3091 Cystitis, unspecified with hematuria: Secondary | ICD-10-CM | POA: Insufficient documentation

## 2023-02-08 DIAGNOSIS — N939 Abnormal uterine and vaginal bleeding, unspecified: Secondary | ICD-10-CM | POA: Diagnosis present

## 2023-02-08 DIAGNOSIS — D72829 Elevated white blood cell count, unspecified: Secondary | ICD-10-CM | POA: Diagnosis not present

## 2023-02-08 HISTORY — DX: Unspecified dementia, unspecified severity, without behavioral disturbance, psychotic disturbance, mood disturbance, and anxiety: F03.90

## 2023-02-08 LAB — COMPREHENSIVE METABOLIC PANEL
ALT: 21 U/L (ref 0–44)
AST: 21 U/L (ref 15–41)
Albumin: 3.6 g/dL (ref 3.5–5.0)
Alkaline Phosphatase: 83 U/L (ref 38–126)
Anion gap: 9 (ref 5–15)
BUN: 15 mg/dL (ref 8–23)
CO2: 24 mmol/L (ref 22–32)
Calcium: 9.1 mg/dL (ref 8.9–10.3)
Chloride: 105 mmol/L (ref 98–111)
Creatinine, Ser: 0.73 mg/dL (ref 0.44–1.00)
GFR, Estimated: >60 mL/min (ref >60)
Glucose, Bld: 109 mg/dL — ABNORMAL HIGH (ref 70–99)
Potassium: 3.5 mmol/L (ref 3.5–5.1)
Sodium: 138 mmol/L (ref 135–145)
Total Bilirubin: 0.5 mg/dL (ref 0.3–1.2)
Total Protein: 6.2 g/dL — ABNORMAL LOW (ref 6.5–8.1)

## 2023-02-08 LAB — CBC WITH DIFFERENTIAL/PLATELET
Abs Immature Granulocytes: 0.03 10*3/uL (ref 0.00–0.07)
Basophils Absolute: 0.1 10*3/uL (ref 0.0–0.1)
Basophils Relative: 1 %
Eosinophils Absolute: 0 10*3/uL (ref 0.0–0.5)
Eosinophils Relative: 0 %
HCT: 42.8 % (ref 36.0–46.0)
Hemoglobin: 13.9 g/dL (ref 12.0–15.0)
Immature Granulocytes: 0 %
Lymphocytes Relative: 15 %
Lymphs Abs: 1.7 10*3/uL (ref 0.7–4.0)
MCH: 30.6 pg (ref 26.0–34.0)
MCHC: 32.5 g/dL (ref 30.0–36.0)
MCV: 94.3 fL (ref 80.0–100.0)
Monocytes Absolute: 1 10*3/uL (ref 0.1–1.0)
Monocytes Relative: 9 %
Neutro Abs: 8.6 10*3/uL — ABNORMAL HIGH (ref 1.7–7.7)
Neutrophils Relative %: 75 %
Platelets: 263 10*3/uL (ref 150–400)
RBC: 4.54 MIL/uL (ref 3.87–5.11)
RDW: 14.6 % (ref 11.5–15.5)
WBC: 11.4 10*3/uL — ABNORMAL HIGH (ref 4.0–10.5)
nRBC: 0 % (ref 0.0–0.2)

## 2023-02-08 LAB — URINALYSIS, ROUTINE W REFLEX MICROSCOPIC
Bilirubin Urine: NEGATIVE
Glucose, UA: NEGATIVE mg/dL
Ketones, ur: NEGATIVE mg/dL
Nitrite: NEGATIVE
Protein, ur: 100 mg/dL — AB
RBC / HPF: >50 RBC/hpf (ref 0–5)
Specific Gravity, Urine: 1.014 (ref 1.005–1.030)
WBC, UA: >50 WBC/hpf (ref 0–5)
pH: 7 (ref 5.0–8.0)

## 2023-02-08 LAB — TYPE AND SCREEN
ABO/RH(D): O POS
Antibody Screen: NEGATIVE

## 2023-02-08 MED ORDER — CEPHALEXIN 500 MG PO CAPS: 500.0000 mg | ORAL_CAPSULE | Freq: Four times a day (QID) | ORAL | 0 refills | Status: AC

## 2023-02-08 MED ORDER — SODIUM CHLORIDE 0.9 % IV SOLN
1.0000 g | Freq: Once | INTRAVENOUS | Status: AC
Start: 2023-02-08 — End: 2023-02-08
  Administered 2023-02-08: 1 g via INTRAVENOUS
  Filled 2023-02-08: qty 10

## 2023-02-08 NOTE — Discharge Instructions (Addendum)
Her bleeding is likely coming from a urinary tract infection.  She has been given IV antibiotics here and prescription written for antibiotics to take for 7 days.  Please follow-up with her PCP for recheck or return to the emergency department for any new or worsening symptoms.

## 2023-02-08 NOTE — ED Triage Notes (Signed)
Pt BIB [facility] from [facility] for blood noted in toilet, unsure of blood in urine or vaginal bleeding.  Pt denies any pain.  Reported pt with dementia.

## 2023-02-08 NOTE — ED Provider Notes (Signed)
[place] EMERGENCY DEPARTMENT AT [facility] Provider Note   CSN: [id number] Arrival date & time: 02/08/23  1325     History  Chief Complaint  Patient presents with   Hematuria    Eileen Kidd is a 82 y.o. female.   Hematuria       Eileen Kidd is a 82 y.o. female with past medical history of dementia, hyperlipidemia, osteoporosis, currently resides at [facility] assisted [facility].  sent to the Emergency Department for evaluation of possible vaginal bleeding.   Per caregiver at the facility, patient was changed this morning and had a normal appearing bowel movement.  Around lunchtime, he was assisted to the bathroom and noticed bright red blood in her pull-up states when she stood up "blood was dripping out."  Continues to have appetite although somewhat diminished.  Denies any recent fever, no vomiting.  Stool this morning was soft.  Patient has history of dementia at baseline and unable to provide history   Home Medications Prior to Admission medications   Medication Sig Start Date End Date Taking? Authorizing Provider  acetaminophen (TYLENOL) 500 MG tablet Take 500 mg by mouth every 6 (six) hours as needed.    [provider]  aspirin 81 MG chewable tablet Chew 81 mg by mouth daily.    [provider]  ferrous sulfate 325 (65 FE) MG tablet Take 325 mg by mouth daily with breakfast.    [provider]  galantamine (RAZADYNE) 4 MG tablet Take 4 mg by mouth daily. 02/03/22   [provider]  memantine (NAMENDA) 10 MG tablet Take 10 mg by mouth daily. 02/03/22   [provider]  Multiple Vitamins-Minerals (MULTIVITAMIN & MINERAL PO) Take 1 tablet by mouth daily.     [provider]  potassium chloride (KLOR-CON M) 10 MEQ tablet Take 10 mEq by mouth daily.    [provider]  potassium chloride (KLOR-CON) 10 MEQ tablet Take 10 mEq by mouth daily. 11/22/22   [provider]  rosuvastatin  (CRESTOR) 40 MG tablet Take 40 mg by mouth daily.    [provider]  sertraline (ZOLOFT) 25 MG tablet Take 25 mg by mouth daily. 11/22/22   [provider]  traMADol (ULTRAM) 50 MG tablet Take 50 mg by mouth every 8 (eight) hours as needed (pain).    [provider]      Allergies    Codeine and Alendronate    Review of Systems   Review of Systems  Unable to perform ROS: Dementia  Genitourinary:  Positive for hematuria.    Physical Exam Updated Vital Signs Temp 98.1 F (36.7 C) (Oral)   Ht 5\' 2"  (1.575 m)   Wt 45 kg   SpO2 98%   BMI 18.15 kg/m  Physical Exam Vitals and nursing note reviewed.  Constitutional:      General: She is not in acute distress.    Appearance: Normal appearance. She is not ill-appearing or toxic-appearing.  HENT:     Mouth/Throat:     Mouth: Mucous membranes are moist.  Cardiovascular:     Rate and Rhythm: Normal rate and regular rhythm.     Pulses: Normal pulses.  Pulmonary:     Effort: Pulmonary effort is normal.     Breath sounds: Normal breath sounds.  Abdominal:     General: There is no distension.     Palpations: Abdomen is soft.     Tenderness: There is no abdominal tenderness.  There is no right CVA tenderness or left CVA tenderness.  Musculoskeletal:     Right lower leg: No edema.     Left lower leg: No edema.  Skin:    General: Skin is warm.     Capillary Refill: Capillary refill takes less than 2 seconds.  Neurological:     General: No focal deficit present.     Mental Status: She is alert.     Sensory: No sensory deficit.     Motor: No weakness.     ED Results / Procedures / Treatments   Labs (all labs ordered are listed, but only abnormal results are displayed) Labs Reviewed  URINALYSIS, ROUTINE W REFLEX MICROSCOPIC - Abnormal; Notable for the following components:      Result Value   APPearance CLOUDY (*)    Hgb urine dipstick LARGE (*)    Protein, ur 100 (*)    Leukocytes,Ua MODERATE (*)     Bacteria, UA RARE (*)    All other components within normal limits  CBC WITH DIFFERENTIAL/PLATELET - Abnormal; Notable for the following components:   WBC 11.4 (*)    Neutro Abs 8.6 (*)    All other components within normal limits  COMPREHENSIVE METABOLIC PANEL - Abnormal; Notable for the following components:   Glucose, Bld 109 (*)    Total Protein 6.2 (*)    All other components within normal limits  URINE CULTURE  TYPE AND SCREEN    EKG None  Radiology No results found.   Procedures Procedures    Medications Ordered in ED Medications - No data to display  ED Course/ Medical Decision Making/ A&P                                 Medical Decision Making Patient here from assisted living facility with baseline history of dementia brought in for evaluation of possible vaginal bleeding versus hematuria.  Noticed this morning while attempting to void.  Overall, patient well-appearing nontoxic.  No CVA tenderness or suprapubic tenderness on my exam.  I suspect blood is related to hematuria.  Will check labs and In-N-Out cath labs  Amount and/or Complexity of Data Reviewed Labs: ordered.    Details: By me, no evidence of significant leukocytosis, hemoglobin reassuring.  Chemistries without significant derangement.  Cath UA shows cloudy urine with large amount of blood moderate leukocytes greater than 50 white cells and rare bacteria.  Urine culture is pending Discussion of management or test interpretation with external provider(s): Patient well-appearing.  No concerning symptoms for urosepsis.  I suspect blood is related to hematuria patient is hemodynamically stable.  Treated here with IV Rocephin.  I feel that she is appropriate back to facility, will prescribe cephalexin.  Risk Prescription drug management.           Final Clinical Impression(s) / ED Diagnoses Final diagnoses:  Hemorrhagic cystitis    Rx / DC Orders ED Discharge Orders     None          Pauline Aus, PA-C 02/12/23 1342    Tanda Rockers A, DO 02/13/23 1255

## 2023-02-09 ENCOUNTER — Telehealth: Payer: Self-pay

## 2023-02-09 NOTE — Transitions of Care (Post Inpatient/ED Visit) (Signed)
   02/09/2023  Name: Eileen Kidd MRN: [id number] DOB: 1941-03-08  Today's TOC FU Call Status: Today's TOC FU Call Status:: Unsuccessful Call (1st Attempt) Unsuccessful Call (1st Attempt) Date: 02/09/23  Attempted to reach the patient regarding the most recent Inpatient/ED visit.  Follow Up Plan: Additional outreach attempts will be made to reach the patient to complete the Transitions of Care (Post Inpatient/ED visit) call.   Signature Karena Addison, LPN [facility] Nurse Health Advisor Direct Dial [phone]

## 2023-02-11 LAB — URINE CULTURE: Culture: >=100000 — AB

## 2023-02-12 ENCOUNTER — Telehealth (HOSPITAL_BASED_OUTPATIENT_CLINIC_OR_DEPARTMENT_OTHER): Payer: Self-pay | Admitting: *Deleted

## 2023-02-12 NOTE — Telephone Encounter (Signed)
Post ED Visit - Positive Culture Follow-up  Culture report reviewed by antimicrobial stewardship pharmacist: [facility] []  Enzo Bi, Pharm.D. []  Celedonio Miyamoto, [address].D., BCPS AQ-ID []  Garvin Fila, Pharm.D., BCPS []  Georgina Pillion, [address].D., BCPS []  [place], [address].D., BCPS, AAHIVP []  Estella Husk, Pharm.D., BCPS, AAHIVP []  Lysle Pearl, PharmD, BCPS []  Phillips Climes, PharmD, BCPS []  Agapito Games, PharmD, BCPS []  Verlan Friends, PharmD []  Mervyn Gay, PharmD, BCPS [x]  Riccardo Dubin, PharmD  [facility] []  Len Childs, PharmD []  Greer Pickerel, PharmD []  Adalberto Cole, PharmD []  Perlie Gold, Rph []  Lonell Face) Jean Rosenthal, PharmD []  Earl Many, PharmD []  Junita Push, PharmD []  Dorna Leitz, PharmD []  Terrilee Files, PharmD []  Lynann Beaver, PharmD []  Keturah Barre, PharmD []  Loralee Pacas, PharmD []  Bernadene Person, PharmD   Positive urine culture Treated with Cephalexin, organism sensitive to the same and no further patient follow-up is required at this time.  Patsey Berthold 02/12/2023, 2:05 PM

## 2023-02-14 NOTE — Transitions of Care (Post Inpatient/ED Visit) (Signed)
   02/14/2023  Name: Eileen Kidd MRN: [id number] DOB: [birth year]  Today's TOC FU Call Status: Today's TOC FU Call Status:: Successful TOC FU Call Completed Unsuccessful Call (1st Attempt) Date: 02/09/23 [facility] FU Call Complete Date: 02/14/23  Transition Care Management Follow-up Telephone Call Date of Discharge: 02/08/23 Discharge Facility: [facility] ([facility]) Type of Discharge: Emergency Department Reason for ED Visit: Other: (cystitis) How have you been since you were released from the hospital?: Better Any questions or concerns?: No  Items Reviewed: Did you receive and understand the discharge instructions provided?: No Medications obtained,verified, and reconciled?: Yes (Medications Reviewed) Any new allergies since your discharge?: No Dietary orders reviewed?: Yes Do you have support at home?: Yes People in Home: facility resident  Medications Reviewed Today: Medications Reviewed Today   Medications were not reviewed in this encounter     Home Care and Equipment/Supplies: Were Home Health Services Ordered?: NA Any new equipment or medical supplies ordered?: NA  Functional Questionnaire: Do you need assistance with bathing/showering or dressing?: Yes Do you need assistance with meal preparation?: Yes Do you need assistance with eating?: No Do you have difficulty maintaining continence: Yes Do you need assistance with getting out of bed/getting out of a chair/moving?: No Do you have difficulty managing or taking your medications?: Yes  Follow up appointments reviewed: PCP Follow-up appointment confirmed?: No (resident of [facility]) MD Provider Line Number:[phone] Given: No Specialist Hospital Follow-up appointment confirmed?: NA Do you need transportation to your follow-up appointment?: No Do you understand care options if your condition(s) worsen?: Yes-patient verbalized understanding    SIGNATURE Karena Addison, LPN [facility] Nurse Health Advisor Direct Dial  [phone]

## 2023-12-13 ENCOUNTER — Other Ambulatory Visit: Payer: Self-pay

## 2023-12-13 ENCOUNTER — Encounter (HOSPITAL_COMMUNITY): Payer: Self-pay | Admitting: Emergency Medicine

## 2023-12-13 ENCOUNTER — Emergency Department (HOSPITAL_COMMUNITY)
Admission: EM | Admit: 2023-12-13 | Discharge: 2023-12-13 | Disposition: A | Discharge status: Skilled Nursing Facility | Attending: Emergency Medicine | Admitting: Emergency Medicine

## 2023-12-13 ENCOUNTER — Emergency Department (HOSPITAL_COMMUNITY)

## 2023-12-13 DIAGNOSIS — S0990XA Unspecified injury of head, initial encounter: Secondary | ICD-10-CM

## 2023-12-13 DIAGNOSIS — F0390 Unspecified dementia without behavioral disturbance: Secondary | ICD-10-CM | POA: Diagnosis not present

## 2023-12-13 DIAGNOSIS — M542 Cervicalgia: Secondary | ICD-10-CM | POA: Insufficient documentation

## 2023-12-13 DIAGNOSIS — S161XXA Strain of muscle, fascia and tendon at neck level, initial encounter: Secondary | ICD-10-CM

## 2023-12-13 DIAGNOSIS — W19XXXA Unspecified fall, initial encounter: Secondary | ICD-10-CM | POA: Diagnosis not present

## 2023-12-13 DIAGNOSIS — Z7982 Long term (current) use of aspirin: Secondary | ICD-10-CM | POA: Insufficient documentation

## 2023-12-13 NOTE — ED Notes (Addendum)
 Patient verbalizes understanding of discharge instructions. Opportunity for questioning and answers were provided. Armband removed by staff, pt discharged from ED. Left with [facility] back to facility

## 2023-12-13 NOTE — ED Triage Notes (Signed)
 Pt in from [facility] via [facility] after fall that was witnessed by her roommate. Per facility, LSN at bedtime, and pt's roommate saw her roll oob onto floor tonight. Roommate hit the call light and staff arrived to find pt face down on floor but conscious and alert. Pt arrives in c-collar and is able to state name and place, which is baseline per EMS.   VS en route: CBG 100 140/90 82HR 93%RA

## 2023-12-13 NOTE — ED Notes (Signed)
 Patient transported to CT

## 2023-12-13 NOTE — ED Provider Notes (Signed)
 [place] EMERGENCY DEPARTMENT AT [facility] Provider Note   CSN: [id number] Arrival date & time: 12/13/23  0341     Patient presents with: Eileen Kidd is a 83 y.o. female.   Patient is an 83 year old female with past medical history of osteoporosis, hyperlipidemia.  Patient sent from her extended care facility for evaluation of a fall.  She was apparently found on the floor.  Patient is not complaining of any specific complaints, but does appear to have a history of dementia.       Prior to Admission medications   Medication Sig Start Date End Date Taking? Authorizing Provider  acetaminophen  (TYLENOL ) 500 MG tablet Take 500 mg by mouth every 6 (six) hours as needed.    [provider]  aspirin 81 MG chewable tablet Chew 81 mg by mouth daily.    [provider]  cephALEXin  (KEFLEX ) 500 MG capsule Take 1 capsule (500 mg total) by mouth 4 (four) times daily. 02/08/23   Triplett, Tammy, PA-C  ferrous sulfate 325 (65 FE) MG tablet Take 325 mg by mouth daily with breakfast.    [provider]  galantamine (RAZADYNE) 4 MG tablet Take 4 mg by mouth daily. 02/03/22   [provider]  memantine (NAMENDA) 10 MG tablet Take 10 mg by mouth daily. 02/03/22   [provider]  Multiple Vitamins-Minerals (MULTIVITAMIN & MINERAL PO) Take 1 tablet by mouth daily.     [provider]  potassium chloride  (KLOR-CON  M) 10 MEQ tablet Take 10 mEq by mouth daily.    [provider]  potassium chloride  (KLOR-CON ) 10 MEQ tablet Take 10 mEq by mouth daily. 11/22/22   [provider]  rosuvastatin  (CRESTOR ) 40 MG tablet Take 40 mg by mouth daily.    [provider]  sertraline (ZOLOFT) 25 MG tablet Take 25 mg by mouth daily. 11/22/22   [provider]  traMADol (ULTRAM) 50 MG tablet Take 50 mg by mouth every 8 (eight) hours as needed (pain).    [provider]    Allergies: Codeine and  Alendronate     Review of Systems  Unable to perform ROS: Dementia    Updated Vital Signs BP (!) 153/96   Pulse 74   Temp 98.3 F (36.8 C) (Oral)   Resp 10   Wt 45 kg   SpO2 92%   BMI 18.15 kg/m   Physical Exam Vitals and nursing note reviewed.  Constitutional:      General: She is not in acute distress.    Appearance: Normal appearance. She is well-developed. She is not diaphoretic.  HENT:     Head: Normocephalic and atraumatic.  Neck:     Comments: There is mild tenderness in the soft tissues of the cervical region.  No bony tenderness or step-off. Cardiovascular:     Rate and Rhythm: Normal rate and regular rhythm.     Heart sounds: No murmur heard.    No friction rub. No gallop.  Pulmonary:     Effort: Pulmonary effort is normal. No respiratory distress.     Breath sounds: Normal breath sounds. No wheezing.  Abdominal:     General: Bowel sounds are normal. There is no distension.     Palpations: Abdomen is soft.     Tenderness: There is no abdominal tenderness.   Musculoskeletal:        General: Normal range of motion.     Cervical back: Normal range of motion and neck  supple.     Comments: Patient has good range of motion of both hips with no discomfort.   Skin:    General: Skin is warm and dry.   Neurological:     General: No focal deficit present.     Mental Status: She is alert and oriented to person, place, and time.     (all labs ordered are listed, but only abnormal results are displayed) Labs Reviewed - No data to display  EKG: None  Radiology: No results found.   Procedures   Medications Ordered in the ED - No data to display                                  Medical Decision Making Amount and/or Complexity of Data Reviewed Radiology: ordered.   CT scan of head and neck negative.  Patient to be discharged with as needed return.  I appreciate no other injuries on exam and she clinically appears well.     Final diagnoses:  None     ED Discharge Orders     None          Orvilla Blander, MD 12/13/23 [phone]

## 2023-12-13 NOTE — Discharge Instructions (Signed)
 Continue medications as previously prescribed.  Return to the ER if you develop any new and/or concerning issues.

## 2024-02-16 ENCOUNTER — Encounter: Payer: Self-pay | Admitting: Radiology

## 2024-04-29 ENCOUNTER — Encounter: Payer: Self-pay | Admitting: Radiology
